# Patient Record
Sex: Female | Born: 1955 | Race: White | Hispanic: No | Marital: Married | Smoking: Never smoker
Health system: Southern US, Community
[De-identification: ages and names within clinical notes are randomized; demographics above are authoritative.]

## PROBLEM LIST (undated history)

## (undated) DIAGNOSIS — K449 Diaphragmatic hernia without obstruction or gangrene: Secondary | ICD-10-CM

## (undated) DIAGNOSIS — K222 Esophageal obstruction: Secondary | ICD-10-CM

## (undated) DIAGNOSIS — T7840XA Allergy, unspecified, initial encounter: Secondary | ICD-10-CM

## (undated) DIAGNOSIS — E041 Nontoxic single thyroid nodule: Secondary | ICD-10-CM

## (undated) DIAGNOSIS — F419 Anxiety disorder, unspecified: Secondary | ICD-10-CM

## (undated) DIAGNOSIS — E785 Hyperlipidemia, unspecified: Secondary | ICD-10-CM

## (undated) HISTORY — DX: Esophageal obstruction: K22.2

## (undated) HISTORY — DX: Allergy, unspecified, initial encounter: T78.40XA

## (undated) HISTORY — DX: Diaphragmatic hernia without obstruction or gangrene: K44.9

## (undated) HISTORY — PX: COLONOSCOPY: SHX174

## (undated) HISTORY — DX: Hyperlipidemia, unspecified: E78.5

## (undated) HISTORY — DX: Anxiety disorder, unspecified: F41.9

## (undated) HISTORY — DX: Nontoxic single thyroid nodule: E04.1

---

## 1989-09-11 HISTORY — PX: DILATION AND CURETTAGE OF UTERUS: SHX78

## 1998-05-11 ENCOUNTER — Other Ambulatory Visit: Admission: RE | Admit: 1998-05-11 | Discharge: 1998-05-11 | Payer: Self-pay | Admitting: Obstetrics & Gynecology

## 1999-05-29 ENCOUNTER — Encounter: Payer: Self-pay | Admitting: Emergency Medicine

## 1999-05-29 ENCOUNTER — Emergency Department (HOSPITAL_COMMUNITY): Admission: EM | Admit: 1999-05-29 | Discharge: 1999-05-29 | Payer: Self-pay | Admitting: Emergency Medicine

## 1999-10-05 ENCOUNTER — Other Ambulatory Visit: Admission: RE | Admit: 1999-10-05 | Discharge: 1999-10-05 | Payer: Self-pay | Admitting: Obstetrics & Gynecology

## 2000-09-12 ENCOUNTER — Other Ambulatory Visit: Admission: RE | Admit: 2000-09-12 | Discharge: 2000-09-12 | Payer: Self-pay | Admitting: Obstetrics & Gynecology

## 2002-06-16 ENCOUNTER — Other Ambulatory Visit: Admission: RE | Admit: 2002-06-16 | Discharge: 2002-06-16 | Payer: Self-pay | Admitting: Obstetrics & Gynecology

## 2003-06-29 ENCOUNTER — Other Ambulatory Visit: Admission: RE | Admit: 2003-06-29 | Discharge: 2003-06-29 | Payer: Self-pay | Admitting: Obstetrics & Gynecology

## 2004-06-29 ENCOUNTER — Other Ambulatory Visit: Admission: RE | Admit: 2004-06-29 | Discharge: 2004-06-29 | Payer: Self-pay | Admitting: Obstetrics & Gynecology

## 2005-10-10 ENCOUNTER — Other Ambulatory Visit: Admission: RE | Admit: 2005-10-10 | Discharge: 2005-10-10 | Payer: Self-pay | Admitting: Obstetrics & Gynecology

## 2013-07-19 ENCOUNTER — Encounter: Payer: Self-pay | Admitting: Cardiology

## 2013-07-20 ENCOUNTER — Encounter: Payer: Self-pay | Admitting: Cardiology

## 2013-07-23 ENCOUNTER — Ambulatory Visit (INDEPENDENT_AMBULATORY_CARE_PROVIDER_SITE_OTHER): Payer: 59 | Admitting: Cardiology

## 2013-07-23 ENCOUNTER — Encounter: Payer: Self-pay | Admitting: Cardiology

## 2013-07-23 VITALS — BP 121/84 | HR 72 | Ht 64.0 in | Wt 122.2 lb

## 2013-07-23 DIAGNOSIS — R002 Palpitations: Secondary | ICD-10-CM

## 2013-07-23 DIAGNOSIS — F411 Generalized anxiety disorder: Secondary | ICD-10-CM

## 2013-07-23 DIAGNOSIS — F419 Anxiety disorder, unspecified: Secondary | ICD-10-CM | POA: Insufficient documentation

## 2013-07-23 DIAGNOSIS — E785 Hyperlipidemia, unspecified: Secondary | ICD-10-CM

## 2013-07-23 LAB — BASIC METABOLIC PANEL
BUN: 14 mg/dL (ref 6–23)
CO2: 30 mEq/L (ref 19–32)
Calcium: 9.5 mg/dL (ref 8.4–10.5)
Chloride: 106 mEq/L (ref 96–112)
Creatinine, Ser: 0.7 mg/dL (ref 0.4–1.2)
GFR: 89.93 mL/min (ref >60.00)
Glucose, Bld: 100 mg/dL — ABNORMAL HIGH (ref 70–99)
Potassium: 4.1 mEq/L (ref 3.5–5.1)
Sodium: 142 mEq/L (ref 135–145)

## 2013-07-23 LAB — TSH: TSH: 2.11 u[IU]/mL (ref 0.35–5.50)

## 2013-07-23 NOTE — Progress Notes (Signed)
[address], [address] [place], [place]  [address] Phone: [phone] Fax:  [phone]  Date:  07/23/2013   ID:  Wanda Barber, DOB May 13, 1956, MRN [id number]  PCP:  Gretel Acre, MD   History of Present Illness: Wanda Barber is a 56 y.o. female with hyperlipidemia status post NMR lipid profile with LDL particle number 796-optimal, small LDL particle less than 90-optimal, LDL-C-84, HDL 72, triglycerides 51 - excellent profile here for evaluation of hyperlipidemia as well as family history of CAD.   She has no prior cardiovascular history. She was recently evaluated with sleep study. She was referred to ENT for snoring - no OSA.  Rare chest pain off and on for years. Has been sharp lasting several minutes, subsides with rest. Can be more of a pressure at times. Mild to moderate. Rarely with exertion. It has been a while since CP. Nuclear stress test December 2010 reassuring, low risk. Walks 3 miles 4 times a week, 30 min with her [place].   Does not sleep well. At night she notes when laying on side, she feels palpitations, skips. Not during day. Sits at work and she does not notice. Feels tightness in thyroid region.    LDL less than 100.   Wt Readings from Last 3 Encounters:  07/23/13 122 lb 3.2 oz (55.43 kg)     Past Medical History  Diagnosis Date  . Hyperlipidemia   . Anxiety     Past Surgical History  Procedure Laterality Date  . Dilation and curettage of uterus      due to miscariage    Current Outpatient Prescriptions  Medication Sig Dispense Refill  . ALPRAZolam (NIRAVAM) 0.25 MG dissolvable tablet Take 0.25 mg by mouth at bedtime as needed for anxiety.      Marland Kitchen aspirin 81 MG tablet Take 81 mg by mouth daily.      Marland Kitchen atorvastatin (LIPITOR) 20 MG tablet Take 20 mg by mouth daily.      . cholecalciferol (VITAMIN D) 1000 UNITS tablet Take 1,000 Units by mouth daily.      . fish oil-omega-3 fatty acids 1000 MG capsule Take 1 g by mouth daily.        No current facility-administered medications for this visit.    Allergies:   No Known Allergies  Social History:  The patient     ROS:  Please see the history of present illness.   Denies any syncope, bleeding, orthopnea, PND   PHYSICAL EXAM: VS:  BP 121/84  Wt 122 lb 3.2 oz (55.43 kg) Well nourished, well developed, in no acute distress HEENT: normal Neck: no JVD Cardiac:  normal S1, S2; RRR; no murmur Lungs:  clear to auscultation bilaterally, no wheezing, rhonchi or rales Abd: soft, nontender, no hepatomegaly Ext: no edema Skin: warm and dry Neuro: no focal abnormalities noted  EKG:  Prior EKG showed sinus rhythm with no other changes     ASSESSMENT AND PLAN:  1. Hyperlipidemia-doing well on atorvastatin. Prior NMR profile reviewed. Reassuring. Continue with diet, exercise. Primary prevention. Father with coronary artery disease. 2. Palpitations-newly described. Way that she is describing laying on left or right side in the middle of the night and feeling an occasional pause like beat, these are suggestive of PVCs or PACs. EKG today reassuring. 3. I will check a basic metabolic profile as well as TSH with her recent palpitations as well as trouble sleeping.  Signed, Donato Schultz, MD [facility]  07/23/2013 10:30  AM    

## 2013-07-23 NOTE — Patient Instructions (Addendum)
Your physician recommends that you continue on your current medications as directed. Please refer to the Current Medication list given to you today.  Your physician recommends that you have labs today: BMET, TSH   Your physician wants you to follow-up in: 1 year with Dr. Anne Fu. You will receive a reminder letter in the mail two months in advance. If you don't receive a letter, please call our office to schedule the follow-up appointment.

## 2013-07-24 ENCOUNTER — Telehealth: Payer: Self-pay | Admitting: *Deleted

## 2013-07-24 NOTE — Telephone Encounter (Signed)
lmtcb for lab results. Number provided 

## 2014-03-11 ENCOUNTER — Other Ambulatory Visit: Payer: Self-pay | Admitting: Family Medicine

## 2014-03-11 DIAGNOSIS — R221 Localized swelling, mass and lump, neck: Secondary | ICD-10-CM

## 2014-03-11 DIAGNOSIS — R220 Localized swelling, mass and lump, head: Secondary | ICD-10-CM

## 2014-03-23 ENCOUNTER — Ambulatory Visit
Admission: RE | Admit: 2014-03-23 | Discharge: 2014-03-23 | Disposition: A | Discharge status: Home / Self Care | Payer: 59 | Source: Ambulatory Visit | Attending: Family Medicine | Admitting: Family Medicine

## 2014-03-23 DIAGNOSIS — R221 Localized swelling, mass and lump, neck: Secondary | ICD-10-CM

## 2014-03-23 DIAGNOSIS — R220 Localized swelling, mass and lump, head: Secondary | ICD-10-CM

## 2015-02-03 ENCOUNTER — Ambulatory Visit: Payer: Self-pay | Admitting: Cardiology

## 2015-02-04 ENCOUNTER — Encounter: Payer: Self-pay | Admitting: Cardiology

## 2015-02-04 ENCOUNTER — Ambulatory Visit (INDEPENDENT_AMBULATORY_CARE_PROVIDER_SITE_OTHER): Payer: 59 | Admitting: Cardiology

## 2015-02-04 VITALS — BP 110/60 | HR 75 | Ht 64.0 in | Wt 127.8 lb

## 2015-02-04 DIAGNOSIS — E785 Hyperlipidemia, unspecified: Secondary | ICD-10-CM | POA: Diagnosis not present

## 2015-02-04 DIAGNOSIS — R0683 Snoring: Secondary | ICD-10-CM | POA: Diagnosis not present

## 2015-02-04 DIAGNOSIS — R002 Palpitations: Secondary | ICD-10-CM | POA: Diagnosis not present

## 2015-02-04 NOTE — Progress Notes (Signed)
[place], [address] [place], [place]  [address] Phone: [phone] Fax:  [phone]  Date:  02/04/2015   ID:  Wanda Barber, DOB [birth year], MRN [id number]  PCP:  Gavin Pound, MD   History of Present Illness: Wanda Barber is a 59 y.o. female with hyperlipidemia status post NMR lipid profile with LDL particle number 796-optimal, small LDL particle less than 90-optimal, LDL-C-84, HDL 72, triglycerides 51 - excellent profile here for evaluation of hyperlipidemia as well as family history of CAD.   She has no prior cardiovascular history. She was recently evaluated with sleep study. She was referred to ENT for snoring - no OSA.  Nuclear stress test December 2010 reassuring, low risk. Walks 3 miles 4 times a week, 30 min with her [place]. Also does yoga.  Does not sleep well. At night she notes when laying on side, she feels palpitations, skips. Not during day. Sits at work and she does not notice. Feels tightness in thyroid region.  LDL less than 100.  Not taking aspirin. She does take fish oil. No bleeding.   Wt Readings from Last 3 Encounters:  02/04/15 127 lb 12.8 oz (57.97 kg)  07/23/13 122 lb 3.2 oz (55.43 kg)     Past Medical History  Diagnosis Date  . Hyperlipidemia   . Anxiety     Past Surgical History  Procedure Laterality Date  . Dilation and curettage of uterus      due to miscariage    Current Outpatient Prescriptions  Medication Sig Dispense Refill  . ALPRAZolam (NIRAVAM) 0.25 MG dissolvable tablet Take 0.25 mg by mouth at bedtime as needed for anxiety.    Marland Kitchen atorvastatin (LIPITOR) 20 MG tablet Take 20 mg by mouth daily.    . cholecalciferol (VITAMIN D) 1000 UNITS tablet Take 1,000 Units by mouth daily.    . fish oil-omega-3 fatty acids 1000 MG capsule Take 1 g by mouth daily.     No current facility-administered medications for this visit.    Allergies:   No Known Allergies  Social History:  The patient  reports that she has  never smoked. She does not have any smokeless tobacco history on file. She reports that she drinks about 0.6 oz of alcohol per week.   ROS:  Please see the history of present illness.   Denies any syncope, bleeding, orthopnea, PND   PHYSICAL EXAM: VS:  BP 110/60 mmHg  Pulse 75  Ht 5\' 4"  (1.626 m)  Wt 127 lb 12.8 oz (57.97 kg)  BMI 21.93 kg/m2 Well nourished, well developed, in no acute distress HEENT: normal Neck: no JVD Cardiac:  normal S1, S2; RRR; no murmur Lungs:  clear to auscultation bilaterally, no wheezing, rhonchi or rales Abd: soft, nontender, no hepatomegaly Ext: no edema Skin: warm and dry Neuro: no focal abnormalities noted  EKG:  Today 02/04/15-normal sinus rhythm, possible left atrial enlargement, nonspecific ST-T wave flattening. Prior EKG showed sinus rhythm with no other changes     ASSESSMENT AND PLAN:  1. Hyperlipidemia-doing well on atorvastatin. Prior NMR profile reviewed. Reassuring. Continue with diet, exercise. Primary prevention. Father with coronary artery disease. 2. Palpitations-newly described. Way that she is describing laying on left or right side in the middle of the night and feeling an occasional pause like beat, these are suggestive of PVCs or PACs. On side, ready to go to sleep.  EKG today reassuring. Lab work was reassuring. TSH reassuring. If these worsen, have low threshold  for 24 or 48 hour Holter monitor. 3. Snoring - had sleep study, no apnea. She asked about correlation with snoring and carotid artery disease. Continue with aggressive primary prevention. No bruits heard on exam. I'm fine with her not taking aspirin for prevention. 4. One-year follow-up.    Signed, Candee Furbish, MD [facility]  02/04/2015 2:03 PM

## 2015-02-04 NOTE — Patient Instructions (Signed)
Medication Instructions:  Your physician recommends that you continue on your current medications as directed. Please refer to the Current Medication list given to you today.  Follow-Up: Follow up in 1 year with Dr. Skains.  You will receive a letter in the mail 2 months before you are due.  Please call us when you receive this letter to schedule your follow up appointment.  Thank you for choosing [facility]!!       

## 2015-03-16 ENCOUNTER — Encounter: Payer: Self-pay | Admitting: Gastroenterology

## 2015-03-17 ENCOUNTER — Encounter: Payer: Self-pay | Admitting: Physician Assistant

## 2015-03-17 ENCOUNTER — Ambulatory Visit (INDEPENDENT_AMBULATORY_CARE_PROVIDER_SITE_OTHER): Payer: 59 | Admitting: Physician Assistant

## 2015-03-17 VITALS — BP 110/64 | HR 62 | Ht 63.5 in | Wt 126.2 lb

## 2015-03-17 DIAGNOSIS — R1314 Dysphagia, pharyngoesophageal phase: Secondary | ICD-10-CM | POA: Diagnosis not present

## 2015-03-17 NOTE — Patient Instructions (Signed)

## 2015-03-17 NOTE — Progress Notes (Addendum)
Patient ID: Wanda Barber, female   DOB: Jul 23, 1956, 59 y.o.   MRN: [id number]   Subjective:    Patient ID: Wanda Barber, female    DOB: [birth year], 59 y.o.   MRN: [id number]  HPI Wanda Barber is a pleasant 59 year old white female, new to GI today referred by [employer] family Barber or evaluation of dysphagia. Patient relates having had a colonoscopy at age 42 for screening. This was done per Wanda Barber  and by patient's report was normal. She believes she is due for follow-up next year. She states that she has had difficulty intermittently with swallowing over the past few years and that this has not been a major issue but more of an annoyance. He says these episodes are infrequent and seemed to depend on what she eats and how fast she eats. She has had difficulty with breads and meats primarily and sometimes barbecue. If she has an episode she has to stop eating, feels the food hanging up and generally with time will go on down. Has had some episodes of regurgitation. She denies any ongoing problems with heartburn or indigestion. Generally these dysphagia episodes occur a few times per month and have not increased in frequency. Her appetite is been fine her weight is been stable. She denies any lower GI problems, specifically no abdominal pain and changes in bowel habits melena or hematochezia. Her family history is negative for colon cancer.  Review of Systems Pertinent positive and negative review of systems were noted in the above HPI section.  All other review of systems was otherwise negative.  Outpatient Encounter Prescriptions as of 03/17/2015  Medication Sig  . ALPRAZolam (NIRAVAM) 0.25 MG dissolvable tablet Take 0.25 mg by mouth at bedtime as needed for anxiety.  Marland Kitchen atorvastatin (LIPITOR) 20 MG tablet Take 20 mg by mouth daily.  . cholecalciferol (VITAMIN D) 1000 UNITS tablet Take 1,000 Units by mouth daily.  . fish oil-omega-3 fatty acids 1000 MG capsule Take 1 g by  mouth daily.  . Multiple Vitamins-Minerals (MULTIVITAMIN WITH MINERALS) tablet Take 1 tablet by mouth daily.   No facility-administered encounter medications on file as of 03/17/2015.   Allergies  Allergen Reactions  . Paxil [Paroxetine Hcl] Other (See Comments)    Decreased appetite, sleep problems   Patient Active Problem List   Diagnosis Date Noted  . Snoring 02/04/2015  . Palpitations 07/23/2013  . Hyperlipidemia   . Anxiety    History   Social History  . Marital Status: Married    Spouse Name: N/A  . Number of Children: 2  . Years of Education: N/A   Occupational History  . Sales    Social History Main Topics  . Smoking status: Never Smoker   . Smokeless tobacco: Not on file     Comment: Occassionally  . Alcohol Use: 0.6 oz/week    1 Glasses of wine per week     Comment: glass of wine with dinner  . Drug Use: Not on file  . Sexual Activity: Not on file   Other Topics Concern  . Not on file   Social History Narrative    Wanda Barber's family history includes Asthma in her mother; Fibromyalgia in her sister; Heart disease in her father; Heart failure in her maternal grandmother and mother; Hypertension in her father; Pulmonary fibrosis in her father; Rheum arthritis in her mother.      Objective:    Filed Vitals:   03/17/15 0933  BP: 110/64  Pulse: 62  Physical Exam  well-developed white female in no acute distress, pleasant blood pressure 110/64 pulse 52 height 5 foot 3 weight 126, BMI 22. HEENT; nontraumatic normocephalic EOMI PERRLA sclera anicteric, Supple; no JVD, Cardiovascular; regular rate and rhythm with S1-S2 no murmur or gallop, Pulmonary; clear bilaterally, Abdomen ;soft nontender nondistended bowel sounds are active there is no palpable mass or hepatosplenomegaly, Rectal ;exam not done, Extremities; no clubbing cyanosis or edema skin warm and dry, Neuropsych; mood and affect appropriate       Assessment & Plan:   #1 59 yo female with  intermittent solid food dysphagia over past few years - R/O esophageal stricture or ring #2 Colon neoplasia surveiilance- negative colonoscopy 9 years ago([facility]) #3 hyperlipidemia  Plan; Will schedule for EGD with possible Dilation  with Dr. Hilarie Barber . Procedure discussed in detail with pt and she is agreeable to proceed.   Will obtain copy of prior Colonoscopy,then place recall notice -2017   Wanda Ferguson PA-C 03/17/2015   Cc: Wanda Pound, MD  Addendum: Reviewed and agree with initial management. Jerene Bears, MD

## 2015-03-18 NOTE — Addendum Note (Signed)
Addended by: Jerene Bears on: 03/18/2015 05:11 PM   Modules accepted: Level of Service

## 2015-04-13 ENCOUNTER — Telehealth: Payer: Self-pay | Admitting: *Deleted

## 2015-04-13 NOTE — Telephone Encounter (Signed)
Received fax from [facility] ( Dr. Perley Jain office). That office representative was not able to find any information on this patient in their system.  We faxed a signed release on 03-17-2015 when this patient saw Amy Esterwood PA-C.

## 2015-04-15 ENCOUNTER — Other Ambulatory Visit: Payer: Self-pay | Admitting: Family Medicine

## 2015-04-15 DIAGNOSIS — E041 Nontoxic single thyroid nodule: Secondary | ICD-10-CM

## 2015-04-29 ENCOUNTER — Encounter: Payer: 59 | Admitting: Internal Medicine

## 2015-06-29 ENCOUNTER — Encounter: Payer: Self-pay | Admitting: Internal Medicine

## 2015-06-29 ENCOUNTER — Ambulatory Visit (AMBULATORY_SURGERY_CENTER): Payer: 59 | Admitting: Internal Medicine

## 2015-06-29 VITALS — BP 122/68 | HR 69 | Temp 97.0°F | Resp 16 | Ht 63.5 in | Wt 126.0 lb

## 2015-06-29 DIAGNOSIS — K222 Esophageal obstruction: Secondary | ICD-10-CM

## 2015-06-29 DIAGNOSIS — R1314 Dysphagia, pharyngoesophageal phase: Secondary | ICD-10-CM

## 2015-06-29 DIAGNOSIS — Q394 Esophageal web: Secondary | ICD-10-CM

## 2015-06-29 MED ORDER — SODIUM CHLORIDE 0.9 % IV SOLN: 500.0000 mL | INTRAVENOUS | Status: DC

## 2015-06-29 NOTE — Op Note (Signed)
[place]  [address] [place], [id number]   ENDOSCOPY PROCEDURE REPORT  PATIENT: Wanda Barber, Wanda Barber  MR#: [id number] BIRTHDATE: 04-30-1956 , 70  yrs. old GENDER: female ENDOSCOPIST: Jerene Bears, MD REFERRED BY:  Leighton Ruff, M.D. PROCEDURE DATE:  06/29/2015 PROCEDURE:  EGD, diagnostic and EGD w/ balloon dilation ASA CLASS:     Class II INDICATIONS:  dysphagia. MEDICATIONS: Monitored anesthesia care and Propofol 350 mg IV TOPICAL ANESTHETIC: none  DESCRIPTION OF PROCEDURE: After the risks benefits and alternatives of the procedure were thoroughly explained, informed consent was obtained.  The LB [id number] endoscope was introduced through the mouth and advanced to the second portion of the duodenum , Without limitations.  The instrument was slowly withdrawn as the mucosa was fully examined.  ESOPHAGUS: Normal esophageal mucosa.  A Schatzki ring was found 36 cm from the incisors, initially requiring mild pressure to cross. Using a TTS-balloon the stricture was dilated initially to 12 mm and then to 13.5 mm.  The balloon was held inflated for 60 seconds. Following this dilation, there was a small mucosal rent.   No evidence of Barrett's mucosal change.  STOMACH: A 4 cm hiatal hernia was noted.   The mucosa of the stomach appeared normal.  DUODENUM: The duodenal mucosa showed no abnormalities in the bulb and 2nd part of the duodenum.  Retroflexed views revealed a hiatal hernia.     The scope was then withdrawn from the patient and the procedure completed.  COMPLICATIONS: There were no immediate complications.  ENDOSCOPIC IMPRESSION: 1.   Schatzki ring was found 36 cm from the incisors; balloon dilation to 13.5 mm 2.   4 cm hiatal hernia 3.   The mucosa of the stomach appeared normal 4.   The duodenal mucosa showed no abnormalities in the bulb and 2nd part of the duodenum  RECOMMENDATIONS: 1.  Anti-reflux regimen 2.  Soft diet today,  slowly advance as tolerated 3.  Office visit next available to assess response.  Repeat dilation as needed  eSigned:  Jerene Bears, MD 06/29/2015 10:21 AM   CC:The Patient and Leighton Ruff, MD

## 2015-06-29 NOTE — Progress Notes (Signed)
Called to room to assist during endoscopic procedure.  Patient ID and intended procedure confirmed with present staff. Received instructions for my participation in the procedure from the performing physician.  

## 2015-06-29 NOTE — Progress Notes (Signed)
To recovery, report to Doyle Askew, RN, VSS,

## 2015-06-29 NOTE — Progress Notes (Signed)
Dental advisory given to patient 

## 2015-06-29 NOTE — Patient Instructions (Addendum)
YOU HAD AN ENDOSCOPIC PROCEDURE TODAY AT [facility]:   Refer to the procedure report that was given to you for any specific questions about what was found during the examination.  If the procedure report does not answer your questions, please call your gastroenterologist to clarify.  If you requested that your care partner not be given the details of your procedure findings, then the procedure report has been included in a sealed envelope for you to review at your convenience later.  YOU SHOULD EXPECT: Some feelings of bloating in the abdomen. Passage of more gas than usual.  Walking can help get rid of the air that was put into your GI tract during the procedure and reduce the bloating. If you had a lower endoscopy (such as a colonoscopy or flexible sigmoidoscopy) you may notice spotting of blood in your stool or on the toilet paper. If you underwent a bowel prep for your procedure, you may not have a normal bowel movement for a few days.  Please Note:  You might notice some irritation and congestion in your nose or some drainage.  This is from the oxygen used during your procedure.  There is no need for concern and it should clear up in a day or so.  SYMPTOMS TO REPORT IMMEDIATELY:   Following upper endoscopy (EGD)  Vomiting of blood or coffee ground material  New chest pain or pain under the shoulder blades  Painful or persistently difficult swallowing  New shortness of breath  Fever of 100F or higher  Black, tarry-looking stools  For urgent or emergent issues, a gastroenterologist can be reached at any hour by calling [phone].   DIET: Your first meal following the procedure should be a small meal and then it is ok to progress to your normal diet. Heavy or fried foods are harder to digest and may make you feel nauseous or bloated.  Likewise, meals heavy in dairy and vegetables can increase bloating.  Drink plenty of fluids but you should avoid alcoholic beverages for  24 hours.  ACTIVITY:  You should plan to take it easy for the rest of today and you should NOT DRIVE or use heavy machinery until tomorrow (because of the sedation medicines used during the test).    FOLLOW UP: Our staff will call the number listed on your records the next business day following your procedure to check on you and address any questions or concerns that you may have regarding the information given to you following your procedure. If we do not reach you, we will leave a message.  However, if you are feeling well and you are not experiencing any problems, there is no need to return our call.  We will assume that you have returned to your regular daily activities without incident.  If any biopsies were taken you will be contacted by phone or by letter within the next 1-3 weeks.  Please call us at [phone] if you have not heard about the biopsies in 3 weeks.    SIGNATURES/CONFIDENTIALITY: You and/or your care partner have signed paperwork which will be entered into your electronic medical record.  These signatures attest to the fact that that the information above on your After Visit Summary has been reviewed and is understood.  Full responsibility of the confidentiality of this discharge information lies with you and/or your care-partner.  Please follow dilation diet instructions given and review dilation diet and GERD handouts provided. Office will call with follow up  appointment date and time.

## 2015-06-30 ENCOUNTER — Telehealth: Payer: Self-pay

## 2015-06-30 NOTE — Telephone Encounter (Signed)
Left a message at [phone] for the pt to call us back if any questions or concerns. maw

## 2015-08-27 ENCOUNTER — Ambulatory Visit: Payer: 59 | Admitting: Internal Medicine

## 2015-08-30 ENCOUNTER — Encounter: Payer: Self-pay | Admitting: *Deleted

## 2015-09-15 ENCOUNTER — Ambulatory Visit (INDEPENDENT_AMBULATORY_CARE_PROVIDER_SITE_OTHER): Payer: 59 | Admitting: Internal Medicine

## 2015-09-15 ENCOUNTER — Encounter: Payer: Self-pay | Admitting: Internal Medicine

## 2015-09-15 VITALS — BP 122/70 | HR 80 | Ht 63.5 in | Wt 128.0 lb

## 2015-09-15 DIAGNOSIS — K449 Diaphragmatic hernia without obstruction or gangrene: Secondary | ICD-10-CM

## 2015-09-15 DIAGNOSIS — R1314 Dysphagia, pharyngoesophageal phase: Secondary | ICD-10-CM | POA: Diagnosis not present

## 2015-09-15 DIAGNOSIS — K219 Gastro-esophageal reflux disease without esophagitis: Secondary | ICD-10-CM | POA: Diagnosis not present

## 2015-09-15 DIAGNOSIS — K222 Esophageal obstruction: Secondary | ICD-10-CM

## 2015-09-15 DIAGNOSIS — Z1211 Encounter for screening for malignant neoplasm of colon: Secondary | ICD-10-CM | POA: Diagnosis not present

## 2015-09-15 DIAGNOSIS — R1319 Other dysphagia: Secondary | ICD-10-CM

## 2015-09-15 DIAGNOSIS — Q394 Esophageal web: Secondary | ICD-10-CM | POA: Diagnosis not present

## 2015-09-15 DIAGNOSIS — R1310 Dysphagia, unspecified: Secondary | ICD-10-CM

## 2015-09-15 MED ORDER — NA SULFATE-K SULFATE-MG SULF 17.5-3.13-1.6 GM/177ML PO SOLN: ORAL | Status: DC

## 2015-09-15 MED ORDER — PANTOPRAZOLE SODIUM 40 MG PO TBEC: 40.0000 mg | DELAYED_RELEASE_TABLET | Freq: Every day | ORAL | Status: DC

## 2015-09-15 NOTE — Progress Notes (Signed)
   Subjective:    Patient ID: Wanda Barber, female    DOB: March 12, 1956, 60 y.o.   MRN: [id number]  HPI  Wanda Barber is a 60 year old female with past medical history of Schatzki's ring , hiatal hernia, hyperlipidemia and anxiety who is here for follow-up. She was initially seen in the summer of 2016  By Nicoletta Ba, PA-C to discuss solid food dysphagia.   Upper endoscopy is recommended and performed on 06/29/2015. This revealed Schatzki's ring which was dilated to 13.5 mm using TTS balloon. This resulted in a mucosal tear and thus ring disruption. There is no evidence of Barrett's esophagus.   She reports that her dysphagia symptoms are better but not 100% resolved. She reports she remains "cautious" with swallowing particular breads, meats and other more coarse solids. No trouble with liquids. She reports less than 5 episodes since endoscopy in October. Symptoms are sporadic. No weight loss. No heartburn or history of heartburn. No odynophagia. No early satiety, nausea or vomiting. No weight loss. She seems to remember trying a "acid reducer" some years ago which didn't seem to help. Bowel movements are regular without blood in her stool or melena. She had screening colonoscopy at age 51 with Dr. Watt Climes,  Which she reports was normal. She is due screening colonoscopy this year. No family history of colon cancer.   Review of Systems  as per history of present illness, otherwise negative  Current Medications, Allergies, Past Medical History, Past Surgical History, Family History and Social History were reviewed in [employer] record.     Objective:   Physical Exam BP 122/70 mmHg  Pulse 80  Ht 5' 3.5" (1.613 m)  Wt 128 lb (58.06 kg)  BMI 22.32 kg/m2 Constitutional: Well-developed and well-nourished. No distress. HEENT: Normocephalic and atraumatic. Oropharynx is clear and moist. No oropharyngeal exudate. Conjunctivae are normal.  No scleral icterus. Neck: Neck  supple. Trachea midline. Cardiovascular: Normal rate, regular rhythm and intact distal pulses. No M/R/G Pulmonary/chest: Effort normal and breath sounds normal. No wheezing, rales or rhonchi. Abdominal: Soft, nontender, nondistended. Bowel sounds active throughout. There are no masses palpable. No hepatosplenomegaly. Extremities: no clubbing, cyanosis, or edema Neurological: Alert and oriented to person place and time. Skin: Skin is warm and dry.  Psychiatric: Normal mood and affect. Behavior is normal.      Assessment & Plan:  60 year old female with past medical history of Schatzki's ring , hiatal hernia, hyperlipidemia and anxiety who is here for follow-up.  1.  Solid food dysphagia/ Schatzki's ring/ hiatal hernia --  She is some better after esophageal dilation to 13.5 mm though she still has some intermittent solid food dysphagia. This may be secondary to silent reflux leading to edema or even esophagitis at the GE junction. We discussed how her hiatal hernia makes reflux  More likely. Trial of pantoprazole 40 mg daily for 1-2 months. PPI trial to see if dysphagia completely resolves. If not, could consider repeat dilation with plan to dilate to a larger size than previously if tolerated/indicated at the time of that endoscopy. If no response to PPI than it should be discontinued. Finally esophageal manometry could be considered if symptoms persist.   2. CRC screening --  Average risk last screening colonoscopy normal at age 51. She will be 60 later this month and is due for repeat screening exam at this time. We discussed the risks, benefits and alternatives and she is agreeable to proceed.

## 2015-09-15 NOTE — Patient Instructions (Signed)
You have been scheduled for a colonoscopy. Please follow written instructions given to you at your visit today.  Please pick up your prep supplies at the pharmacy within the next 1-3 days. If you use inhalers (even only as needed), please bring them with you on the day of your procedure. Your physician has requested that you go to www.startemmi.com and enter the access code given to you at your visit today. This web site gives a general overview about your procedure. However, you should still follow specific instructions given to you by our office regarding your preparation for the procedure.  We have sent the following medications to your pharmacy for you to pick up at your convenience: Protonix 40 mg daily

## 2015-10-01 ENCOUNTER — Encounter: Payer: Self-pay | Admitting: Internal Medicine

## 2015-11-04 ENCOUNTER — Telehealth: Payer: Self-pay | Admitting: *Deleted

## 2015-11-04 NOTE — Telephone Encounter (Signed)
-----   Message from Larina Bras, [place] sent at 09/15/2015 10:56 AM EST ----- Does pt need endo? Already scheduled for colon 11/19/15. Per pyrtle may need to add endo depending on pt response to pantoprazole. See 09/15/15 office note.... If NOT needed, take hold off 4 pm slot 11/19/15

## 2015-11-04 NOTE — Telephone Encounter (Signed)
I contacted patient. She states that she is actually better on the pantoprazole but would like to call us back at the beginning of next week with a decision of whether she would like to have endoscopy or just colonoscopy.

## 2015-11-12 NOTE — Telephone Encounter (Signed)
Patient called back to let us know that she would like to only have colonoscopy. Therefore, I will take hold off of the 4 pm slot 11/19/15.

## 2015-11-18 NOTE — Progress Notes (Signed)
Pt was originally scheduled for 3:30 pm on 11/19/15, called pt to see if she could come in at 1:30 pm instead, pt will come in at 1:30 pm 11/19/15, advised pt to start her second dose of prep at 8:30 am, nothing to drink after 10 am and she needs to arrive here by 12:30 pm. Pt verbalized understanding of new instructions-adm

## 2015-11-19 ENCOUNTER — Encounter: Payer: Self-pay | Admitting: Internal Medicine

## 2015-11-19 ENCOUNTER — Ambulatory Visit (AMBULATORY_SURGERY_CENTER): Payer: 59 | Admitting: Internal Medicine

## 2015-11-19 VITALS — BP 161/86 | HR 68 | Temp 99.8°F | Resp 13 | Ht 63.5 in | Wt 128.0 lb

## 2015-11-19 DIAGNOSIS — Z1211 Encounter for screening for malignant neoplasm of colon: Secondary | ICD-10-CM

## 2015-11-19 DIAGNOSIS — D123 Benign neoplasm of transverse colon: Secondary | ICD-10-CM

## 2015-11-19 DIAGNOSIS — D125 Benign neoplasm of sigmoid colon: Secondary | ICD-10-CM

## 2015-11-19 HISTORY — PX: COLONOSCOPY: SHX174

## 2015-11-19 MED ORDER — SODIUM CHLORIDE 0.9 % IV SOLN: 500.0000 mL | INTRAVENOUS | Status: DC

## 2015-11-19 NOTE — Op Note (Signed)
Wanda Barber: Wanda Barber Procedure Date: 11/19/2015 1:05 PM MRN: [id number] Endoscopist: Jerene Bears , MD Age: 60 Referring MD:  Date of Birth: November 04, 1955 Gender: Female Procedure:                Colonoscopy Indications:              Screening for colorectal malignant neoplasm, Last                            colonoscopy 10 years ago Medicines:                Monitored Anesthesia Care Procedure:                Pre-Anesthesia Assessment:                           - Prior to the procedure, a History and Physical                            was performed, and patient medications and                            allergies were reviewed. The patient's tolerance of                            previous anesthesia was also reviewed. The risks                            and benefits of the procedure and the sedation                            options and risks were discussed with the patient.                            All questions were answered, and informed consent                            was obtained. Prior Anticoagulants: The patient has                            taken no previous anticoagulant or antiplatelet                            agents. ASA Grade Assessment: II - A patient with                            mild systemic disease. After reviewing the risks                            and benefits, the patient was deemed in                            satisfactory condition to undergo the procedure.  After obtaining informed consent, the colonoscope                            was passed under direct vision. Throughout the                            procedure, the patient's blood pressure, pulse, and                            oxygen saturations were monitored continuously. The                            Model PCF-H190L [id number]) scope was introduced                            through the anus and advanced to the the cecum,                     identified by appendiceal orifice and ileocecal                            valve. The colonoscopy was performed without                            difficulty. The patient tolerated the procedure                            well. The quality of the bowel preparation was                            good. The ileocecal valve, appendiceal orifice, and                            rectum were photographed. Scope In: 1:44:06 PM Scope Out: 1:57:52 PM Scope Withdrawal Time: 0 hours 11 minutes 24 seconds  Total Procedure Duration: 0 hours 13 minutes 46 seconds  Findings:      The digital rectal exam was normal.      Two sessile polyps were found in the distal sigmoid colon and proximal       transverse colon. The polyps were 3 to 5 mm in size. These polyps were       removed with a cold snare. Resection and retrieval were complete.      Multiple small-mouthed diverticula were found in the hepatic flexure and       left colon.      The retroflexed view of the distal rectum and anal verge was normal and       showed no anal or rectal abnormalities. Complications:            No immediate complications. Estimated Blood Loss:     Estimated blood loss was minimal. Impression:               - Two 3 to 5 mm polyps in the distal sigmoid colon                            and in the proximal transverse colon,  removed with                            a cold snare. Resected and retrieved.                           - Mild diverticulosis at the hepatic flexure and in                            the left colon.                           - The distal rectum and anal verge are normal on                            retroflexion view. Recommendation:           - Patient has a contact number available for                            emergencies. The signs and symptoms of potential                            delayed complications were discussed with the                            patient. Return to normal  activities tomorrow.                            Written discharge instructions were provided to the                            patient.                           - Resume previous diet.                           - Continue present medications.                           - Await pathology results.                           - Repeat colonoscopy is recommended for                            surveillance. The colonoscopy date will be                            determined after pathology results from today's                            exam become available for review. Procedure Code(s):        --- Professional ---  45385, Colonoscopy, flexible; with removal of                            tumor(s), polyp(s), or other lesion(s) by snare                            technique CPT copyright 2016 [employer] Association. All rights reserved. Lajuan Lines. Hilarie Fredrickson, MD Jerene Bears, MD 11/19/2015 2:02:03 PM This report has been signed electronically. Number of Addenda: 0

## 2015-11-19 NOTE — Patient Instructions (Signed)
YOU HAD AN ENDOSCOPIC PROCEDURE TODAY AT [facility]:   Refer to the procedure report that was given to you for any specific questions about what was found during the examination.  If the procedure report does not answer your questions, please call your gastroenterologist to clarify.  If you requested that your care partner not be given the details of your procedure findings, then the procedure report has been included in a sealed envelope for you to review at your convenience later.  YOU SHOULD EXPECT: Some feelings of bloating in the abdomen. Passage of more gas than usual.  Walking can help get rid of the air that was put into your GI tract during the procedure and reduce the bloating. If you had a lower endoscopy (such as a colonoscopy or flexible sigmoidoscopy) you may notice spotting of blood in your stool or on the toilet paper. If you underwent a bowel prep for your procedure, you may not have a normal bowel movement for a few days.  Please Note:  You might notice some irritation and congestion in your nose or some drainage.  This is from the oxygen used during your procedure.  There is no need for concern and it should clear up in a day or so.  SYMPTOMS TO REPORT IMMEDIATELY:   Following lower endoscopy (colonoscopy or flexible sigmoidoscopy):  Excessive amounts of blood in the stool  Significant tenderness or worsening of abdominal pains  Swelling of the abdomen that is new, acute  Fever of 100F or higher  For urgent or emergent issues, a gastroenterologist can be reached at any hour by calling [phone].   DIET: Your first meal following the procedure should be a small meal and then it is ok to progress to your normal diet. Heavy or fried foods are harder to digest and may make you feel nauseous or bloated.  Likewise, meals heavy in dairy and vegetables can increase bloating.  Drink plenty of fluids but you should avoid alcoholic beverages for 24  hours.  ACTIVITY:  You should plan to take it easy for the rest of today and you should NOT DRIVE or use heavy machinery until tomorrow (because of the sedation medicines used during the test).    FOLLOW UP: Our staff will call the number listed on your records the next business day following your procedure to check on you and address any questions or concerns that you may have regarding the information given to you following your procedure. If we do not reach you, we will leave a message.  However, if you are feeling well and you are not experiencing any problems, there is no need to return our call.  We will assume that you have returned to your regular daily activities without incident.  If any biopsies were taken you will be contacted by phone or by letter within the next 1-3 weeks.  Please call us at [phone] if you have not heard about the biopsies in 3 weeks.    SIGNATURES/CONFIDENTIALITY: You and/or your care partner have signed paperwork which will be entered into your electronic medical record.  These signatures attest to the fact that that the information above on your After Visit Summary has been reviewed and is understood.  Full responsibility of the confidentiality of this discharge information lies with you and/or your care-partner.  Please review polyp, diverticulosis, and high fiber diet handouts provided. Next colonoscopy determined by pathology results.

## 2015-11-19 NOTE — Progress Notes (Signed)
Called to room to assist during endoscopic procedure.  Patient ID and intended procedure confirmed with present staff. Received instructions for my participation in the procedure from the performing physician.  

## 2015-11-19 NOTE — Progress Notes (Signed)
Report to PACU, RN, vss, BBS= Clear.  

## 2015-11-22 ENCOUNTER — Telehealth: Payer: Self-pay

## 2015-11-22 NOTE — Telephone Encounter (Signed)
  Follow up Call-  Call back number 11/19/2015 06/29/2015  Post procedure Call Back phone  # [phone]  Permission to leave phone message Yes Yes     Patient questions:  Do you have a fever, pain , or abdominal swelling? No. Pain Score  0 *  Have you tolerated food without any problems? Yes.    Have you been able to return to your normal activities? Yes.    Do you have any questions about your discharge instructions: Diet   No. Medications  No. Follow up visit  No.  Do you have questions or concerns about your Care? No.  Actions: * If pain score is 4 or above: No action needed, pain <4.

## 2015-11-23 ENCOUNTER — Telehealth: Payer: Self-pay | Admitting: Internal Medicine

## 2015-11-23 MED ORDER — PANTOPRAZOLE SODIUM 40 MG PO TBEC: 40.0000 mg | DELAYED_RELEASE_TABLET | Freq: Every day | ORAL | Status: DC

## 2015-11-23 NOTE — Telephone Encounter (Signed)
Rx sent 

## 2015-11-24 ENCOUNTER — Encounter: Payer: Self-pay | Admitting: Internal Medicine

## 2015-12-27 ENCOUNTER — Other Ambulatory Visit: Payer: Self-pay | Admitting: Family Medicine

## 2015-12-27 DIAGNOSIS — E041 Nontoxic single thyroid nodule: Secondary | ICD-10-CM

## 2016-01-03 ENCOUNTER — Ambulatory Visit
Admission: RE | Admit: 2016-01-03 | Discharge: 2016-01-03 | Disposition: A | Discharge status: Home / Self Care | Payer: 59 | Source: Ambulatory Visit | Attending: Family Medicine | Admitting: Family Medicine

## 2016-01-03 DIAGNOSIS — E041 Nontoxic single thyroid nodule: Secondary | ICD-10-CM

## 2016-03-23 ENCOUNTER — Other Ambulatory Visit: Payer: Self-pay | Admitting: Family Medicine

## 2016-03-23 DIAGNOSIS — E041 Nontoxic single thyroid nodule: Secondary | ICD-10-CM

## 2016-04-07 ENCOUNTER — Encounter: Payer: Self-pay | Admitting: Cardiology

## 2016-04-07 ENCOUNTER — Ambulatory Visit (INDEPENDENT_AMBULATORY_CARE_PROVIDER_SITE_OTHER): Payer: 59 | Admitting: Cardiology

## 2016-04-07 VITALS — BP 120/70 | HR 68 | Ht 64.0 in | Wt 132.4 lb

## 2016-04-07 DIAGNOSIS — Z8249 Family history of ischemic heart disease and other diseases of the circulatory system: Secondary | ICD-10-CM

## 2016-04-07 DIAGNOSIS — R011 Cardiac murmur, unspecified: Secondary | ICD-10-CM | POA: Diagnosis not present

## 2016-04-07 DIAGNOSIS — R002 Palpitations: Secondary | ICD-10-CM

## 2016-04-07 DIAGNOSIS — E785 Hyperlipidemia, unspecified: Secondary | ICD-10-CM | POA: Diagnosis not present

## 2016-04-07 NOTE — Progress Notes (Signed)
[facility]. [address] [place], [place]  [address] Phone: [phone] Fax:  [phone]  Date:  04/07/2016   ID:  Wanda Barber, DOB [birth year], MRN [id number]  PCP:  Gerrit Heck, MD   History of Present Illness: Wanda Barber is a 60 y.o. female with hyperlipidemia status post NMR lipid profile with LDL particle number 796-optimal, small LDL particle less than 90-optimal, LDL-C-84, HDL 72, triglycerides 51 - excellent profile here for evaluation of hyperlipidemia as well as family history of CAD.   She has no prior cardiovascular history. She was evaluated with sleep study. She was referred to ENT for snoring - no OSA.  Nuclear stress test December 2010 reassuring, low risk. Walks 3 miles 4 times a week, 30 min. Also does yoga.  At night she notes when laying on side, she feels palpitations, skips. Not during day. Sits at work and she does not notice. LDL less than 100.  Not taking aspirin. She does take fish oil. No bleeding.   Wt Readings from Last 3 Encounters:  04/07/16 132 lb 6.4 oz (60.1 kg)  11/19/15 128 lb (58.1 kg)  09/15/15 128 lb (58.1 kg)     Past Medical History:  Diagnosis Date  . Allergy    SEASONAL  . Anxiety   . Hiatal hernia   . Hyperlipidemia   . Schatzki's ring   . Thyroid nodule     Past Surgical History:  Procedure Laterality Date  . COLONOSCOPY    . DILATION AND CURETTAGE OF UTERUS  1991   due to miscariage    Current Outpatient Prescriptions  Medication Sig Dispense Refill  . ALPRAZolam (NIRAVAM) 0.25 MG dissolvable tablet Take 0.25 mg by mouth at bedtime as needed for anxiety.    Marland Kitchen atorvastatin (LIPITOR) 20 MG tablet Take 20 mg by mouth daily.    . cholecalciferol (VITAMIN D) 1000 UNITS tablet Take 1,000 Units by mouth daily.    . fish oil-omega-3 fatty acids 1000 MG capsule Take 1 g by mouth daily.    . Multiple Vitamins-Minerals (MULTIVITAMIN WITH MINERALS) tablet Take 1 tablet by mouth daily.    .  pantoprazole (PROTONIX) 40 MG tablet Take 1 tablet (40 mg total) by mouth daily. 90 tablet 2   No current facility-administered medications for this visit.     Allergies:    Allergies  Allergen Reactions  . Paxil [Paroxetine Hcl] Other (See Comments)    Decreased appetite, sleep problems    Social History:  The patient  reports that she has never smoked. She does not have any smokeless tobacco history on file. She reports that she drinks about 0.6 oz of alcohol per week . She reports that she does not use drugs.   ROS:  Please see the history of present illness.   Denies any syncope, bleeding, orthopnea, PND   PHYSICAL EXAM: VS:  BP 120/70 (Patient Position: Sitting, Cuff Size: Normal)   Pulse 68   Ht 5\' 4"  (1.626 m)   Wt 132 lb 6.4 oz (60.1 kg)   BMI 22.73 kg/m  Well nourished, well developed, in no acute distress  HEENT: normal  Neck: no JVD  Cardiac:  normal S1, S2; RRR; soft 1/6 systolic murmur  Lungs:  clear to auscultation bilaterally, no wheezing, rhonchi or rales  Abd: soft, nontender, no hepatomegaly  Ext: no edema  Skin: warm and dry  Neuro: no focal abnormalities noted  EKG:  Today ordered-04/07/16-sinus rhythm, 69, no other abnormalities. 02/04/15-normal  sinus rhythm, possible left atrial enlargement, nonspecific ST-T wave flattening. Prior EKG showed sinus rhythm with no other changes     ASSESSMENT AND PLAN:  1. Hyperlipidemia-doing well on atorvastatin. Prior NMR profile reviewed. Reassuring. Continue with diet, exercise. Primary prevention. Father with coronary artery disease. 2. Heart murmur-soft systolic murmur 1/6 heard right upper sternal border. We will check echocardiogram. His been several years since last evaluation. 3. Palpitations-newly described. Way that she is describing laying on left or right side in the middle of the night and feeling an occasional pause like beat, these are suggestive of PVCs or PACs. On side, ready to go to sleep.  EKG today  reassuring. Lab work was reassuring. TSH reassuring. If these worsen, have low threshold for 24 or 48 hour Holter monitor. Right now I would not give her any beta blockers or calcium channel blockers. Conservative management. No high risk symptoms. 4. Snoring - had sleep study, no apnea. She previously asked about correlation with snoring and carotid artery disease. Continue with aggressive primary prevention. No bruits heard on exam. I'm fine with her not taking aspirin for prevention if she wishes. Family history of heart disease. 5. One-year follow-up.    Signed, Candee Furbish, MD [facility]  04/07/2016 3:58 PM

## 2016-04-07 NOTE — Patient Instructions (Signed)

## 2016-05-08 ENCOUNTER — Ambulatory Visit (HOSPITAL_COMMUNITY): Payer: 59 | Attending: Cardiology

## 2016-05-08 ENCOUNTER — Other Ambulatory Visit: Payer: Self-pay

## 2016-05-08 DIAGNOSIS — I340 Nonrheumatic mitral (valve) insufficiency: Secondary | ICD-10-CM | POA: Insufficient documentation

## 2016-05-08 DIAGNOSIS — I071 Rheumatic tricuspid insufficiency: Secondary | ICD-10-CM | POA: Diagnosis not present

## 2016-05-08 DIAGNOSIS — E785 Hyperlipidemia, unspecified: Secondary | ICD-10-CM | POA: Diagnosis not present

## 2016-05-08 DIAGNOSIS — R011 Cardiac murmur, unspecified: Secondary | ICD-10-CM | POA: Diagnosis not present

## 2016-05-08 DIAGNOSIS — I429 Cardiomyopathy, unspecified: Secondary | ICD-10-CM | POA: Diagnosis present

## 2016-05-08 DIAGNOSIS — R002 Palpitations: Secondary | ICD-10-CM | POA: Insufficient documentation

## 2016-05-12 ENCOUNTER — Telehealth: Payer: Self-pay | Admitting: Cardiology

## 2016-05-12 NOTE — Telephone Encounter (Signed)
Patient called with echo results. Voiced understanding.   Asked about MVP - states she was told she had this years ago. Informed her that this was not detected on her recent echo.

## 2016-05-12 NOTE — Telephone Encounter (Signed)
New message     Pt returning nurse call to receive her test results. Please call.

## 2016-06-19 ENCOUNTER — Other Ambulatory Visit: Payer: 59

## 2016-06-22 ENCOUNTER — Ambulatory Visit
Admission: RE | Admit: 2016-06-22 | Discharge: 2016-06-22 | Disposition: A | Discharge status: Home / Self Care | Payer: 59 | Source: Ambulatory Visit | Attending: Family Medicine | Admitting: Family Medicine

## 2016-06-22 DIAGNOSIS — E041 Nontoxic single thyroid nodule: Secondary | ICD-10-CM

## 2016-08-29 ENCOUNTER — Other Ambulatory Visit: Payer: Self-pay | Admitting: Internal Medicine

## 2016-10-05 DIAGNOSIS — Z1231 Encounter for screening mammogram for malignant neoplasm of breast: Secondary | ICD-10-CM | POA: Diagnosis not present

## 2016-10-05 DIAGNOSIS — Z01419 Encounter for gynecological examination (general) (routine) without abnormal findings: Secondary | ICD-10-CM | POA: Diagnosis not present

## 2016-10-12 ENCOUNTER — Other Ambulatory Visit: Payer: Self-pay | Admitting: Internal Medicine

## 2017-03-26 DIAGNOSIS — Z0000 Encounter for general adult medical examination without abnormal findings: Secondary | ICD-10-CM | POA: Diagnosis not present

## 2017-03-26 DIAGNOSIS — E785 Hyperlipidemia, unspecified: Secondary | ICD-10-CM | POA: Diagnosis not present

## 2017-07-09 ENCOUNTER — Other Ambulatory Visit: Payer: Self-pay | Admitting: Internal Medicine

## 2017-07-12 ENCOUNTER — Encounter: Payer: Self-pay | Admitting: Cardiology

## 2017-07-12 ENCOUNTER — Ambulatory Visit (INDEPENDENT_AMBULATORY_CARE_PROVIDER_SITE_OTHER): Payer: 59 | Admitting: Cardiology

## 2017-07-12 ENCOUNTER — Encounter (INDEPENDENT_AMBULATORY_CARE_PROVIDER_SITE_OTHER): Payer: Self-pay

## 2017-07-12 VITALS — BP 120/74 | HR 67 | Ht 64.0 in | Wt 133.0 lb

## 2017-07-12 DIAGNOSIS — E7800 Pure hypercholesterolemia, unspecified: Secondary | ICD-10-CM | POA: Diagnosis not present

## 2017-07-12 DIAGNOSIS — R002 Palpitations: Secondary | ICD-10-CM

## 2017-07-12 DIAGNOSIS — I340 Nonrheumatic mitral (valve) insufficiency: Secondary | ICD-10-CM

## 2017-07-12 DIAGNOSIS — Z8249 Family history of ischemic heart disease and other diseases of the circulatory system: Secondary | ICD-10-CM

## 2017-07-12 NOTE — Patient Instructions (Signed)
Medication Instructions:  The current medical regimen is effective;  continue present plan and medications.  Follow-Up: Follow up in 2 years with Dr. Skains.  You will receive a letter in the mail 2 months before you are due.  Please call us when you receive this letter to schedule your follow up appointment.  If you need a refill on your cardiac medications before your next appointment, please call your pharmacy.  Thank you for choosing [facility]!!     

## 2017-07-12 NOTE — Progress Notes (Signed)
[facility]. [address], [address] [place], [place]  [address] Phone: [phone] Fax:  [phone]  Date:  07/12/2017   ID:  Wanda Barber, DOB 23-Nov-1955, MRN [id number]  PCP:  Leighton Ruff, MD   History of Present Illness: Wanda Barber is a 61 y.o. female with hyperlipidemia status post NMR lipid profile with LDL particle number 796-optimal, small LDL particle less than 90-optimal, LDL-C-84, HDL 72, triglycerides 51 - excellent profile here for evaluation of hyperlipidemia as well as family history of CAD.   She has no prior cardiovascular history. She was evaluated with sleep study. She was referred to ENT for snoring - no OSA.  Nuclear stress test December 2010 reassuring, low risk. Walks 3 miles 4 times a week, 30 min. Also does yoga.  At night she notes when laying on side, she feels palpitations, skips. Not during day. Sits at work and she does not notice. LDL less than 100.  Not taking aspirin. She does take fish oil. No bleeding.   Wt Readings from Last 3 Encounters:  07/12/17 133 lb (60.3 kg)  04/07/16 132 lb 6.4 oz (60.1 kg)  11/19/15 128 lb (58.1 kg)     Past Medical History:  Diagnosis Date  . Allergy    SEASONAL  . Anxiety   . Hiatal hernia   . Hyperlipidemia   . Schatzki's ring   . Thyroid nodule     Past Surgical History:  Procedure Laterality Date  . COLONOSCOPY    . DILATION AND CURETTAGE OF UTERUS  1991   due to miscariage    Current Outpatient Prescriptions  Medication Sig Dispense Refill  . ALPRAZolam (NIRAVAM) 0.25 MG dissolvable tablet Take 0.25 mg by mouth at bedtime as needed for anxiety.    Marland Kitchen atorvastatin (LIPITOR) 20 MG tablet Take 20 mg by mouth daily.    . cholecalciferol (VITAMIN D) 1000 UNITS tablet Take 1,000 Units by mouth daily.    . Cyanocobalamin (VITAMIN B-12) 500 MCG LOZG Take 2 lozenges by mouth daily.    . fish oil-omega-3 fatty acids 1000 MG capsule Take 1 g by mouth daily.    . Multiple Vitamins-Minerals  (MULTIVITAMIN WITH MINERALS) tablet Take 1 tablet by mouth daily.    . pantoprazole (PROTONIX) 40 MG tablet TAKE 1 TABLET BY MOUTH EVERY DAY 90 tablet 0   No current facility-administered medications for this visit.     Allergies:    Allergies  Allergen Reactions  . Paxil [Paroxetine Hcl] Other (See Comments)    Decreased appetite, sleep problems    Social History:  The patient  reports that she has never smoked. She has never used smokeless tobacco. She reports that she drinks about 0.6 oz of alcohol per week . She reports that she does not use drugs.   ROS:  Please see the history of present illness.   Denies any syncope, bleeding, orthopnea, PND   PHYSICAL EXAM: VS:  BP 120/74   Pulse 67   Ht 5\' 4"  (1.626 m)   Wt 133 lb (60.3 kg)   SpO2 98%   BMI 22.83 kg/m  Well nourished, well developed, in no acute distress  HEENT: normal  Neck: no JVD  Cardiac:  normal S1, S2; RRR; soft 1/6 systolic murmur  Lungs:  clear to auscultation bilaterally, no wheezing, rhonchi or rales  Abd: soft, nontender, no hepatomegaly  Ext: no edema  Skin: warm and dry  Neuro: no focal abnormalities noted  EKG:  Today ordered-  07/12/17-sinus rhythm 65 no other abnormalities.  04/07/16-sinus rhythm, 69, no other abnormalities. 02/04/15-normal sinus rhythm, possible left atrial enlargement, nonspecific ST-T wave flattening. Prior EKG showed sinus rhythm with no other changes     ECHO 05/08/16:  - Left ventricle: The cavity size was normal. Wall thickness was   normal. Systolic function was normal. The estimated ejection   fraction was in the range of 60% to 65%. Wall motion was normal;   there were no regional wall motion abnormalities. - Mitral valve: There was mild regurgitation. - Tricuspid valve: There was trivial regurgitation. - Pulmonary arteries: Systolic pressure was mildly increased. PA   peak pressure: 37 mm Hg (S).  ASSESSMENT AND PLAN:  1. Hyperlipidemia-she admits that she had stopped  her atorvastatin and her cholesterol levels had increased.  Dr. Drema Dallas is rechecking.  Prior NMR profile reviewed.  Continue with diet, exercise. Primary prevention. Father with coronary artery disease. 2. Mitral regurgitation mild/heart murmur-soft systolic murmur 1/6 heard right upper sternal border. 3. Palpitations-stable.  way that she is describing laying on left or right side in the middle of the night and feeling an occasional pause like beat, these are suggestive of PVCs or PACs. On side, ready to go to sleep.  EKG today reassuring. Lab work was reassuring. TSH reassuring. If these worsen, have low threshold for 24 or 48 hour Holter monitor. Right now I would not give her any beta blockers or calcium channel blockers. Conservative management. No high risk symptoms.  Stable. 4. Snoring - had sleep study, no apnea. She previously asked about correlation with snoring and carotid artery disease. Continue with aggressive primary prevention. No bruits heard on exam. I'm fine with her not taking aspirin for prevention if she wishes.  Understand bleeding risks.  Family history of heart disease. 5. 2-year follow-up.    Signed, Candee Furbish, MD [facility]  07/12/2017 3:57 PM

## 2017-07-20 DIAGNOSIS — E7800 Pure hypercholesterolemia, unspecified: Secondary | ICD-10-CM | POA: Diagnosis not present

## 2017-07-20 DIAGNOSIS — R7989 Other specified abnormal findings of blood chemistry: Secondary | ICD-10-CM | POA: Diagnosis not present

## 2017-08-27 DIAGNOSIS — H2513 Age-related nuclear cataract, bilateral: Secondary | ICD-10-CM | POA: Diagnosis not present

## 2017-10-10 ENCOUNTER — Other Ambulatory Visit: Payer: Self-pay | Admitting: Internal Medicine

## 2017-10-15 IMAGING — US US THYROID
1 series · 14 of 25 positions shown · non-contrast
Comparison: 03/23/2014

CLINICAL DATA: 60-year-old female with a history of nodule
follow-up

EXAM:
THYROID ULTRASOUND
TECHNIQUE: Ultrasound examination of the thyroid gland and adjacent soft
tissues was performed.

[Series 1: us thyroid · 0.06mm/px · 14 of 31 slices shown]
[im 1/31]
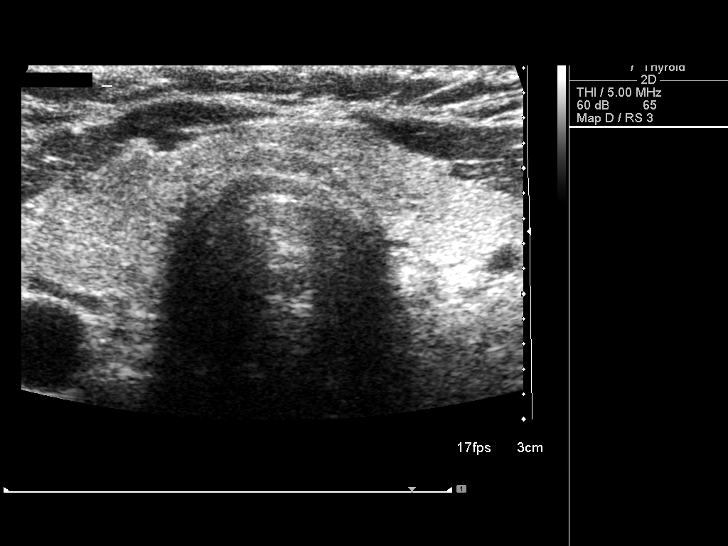
[im 3/31]
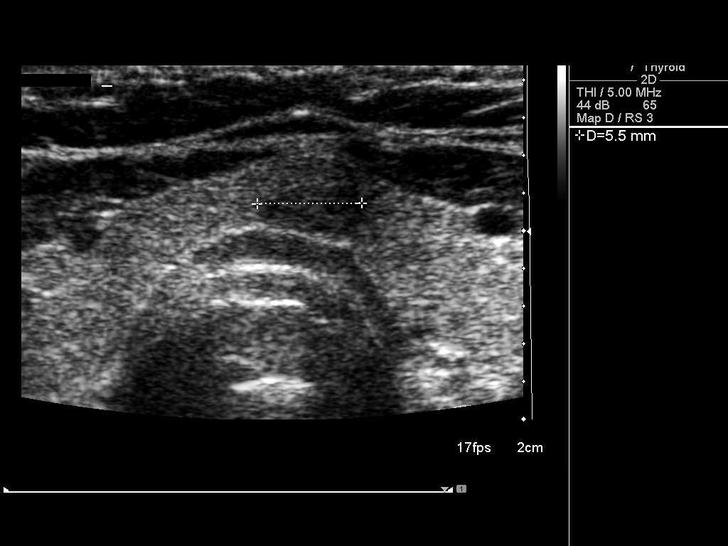
[im 6/31]
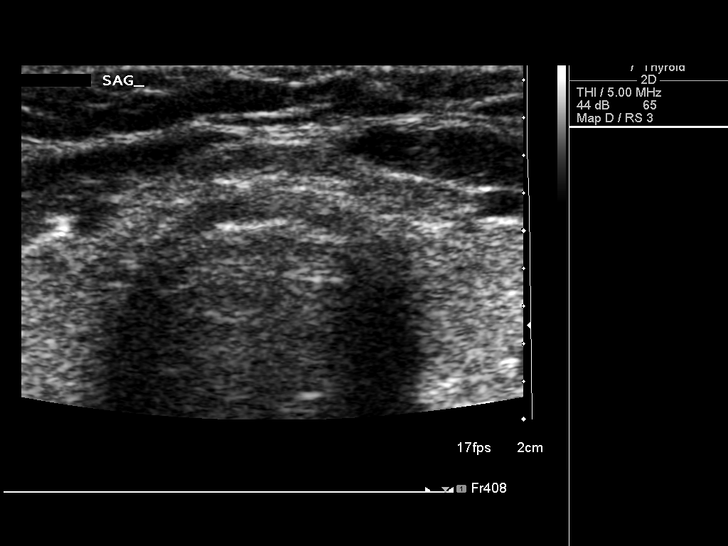
[im 8/31]
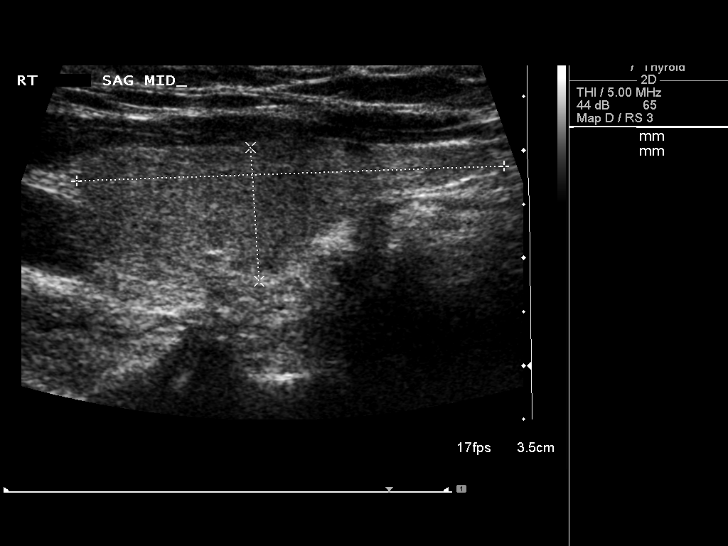
[im 11/31]
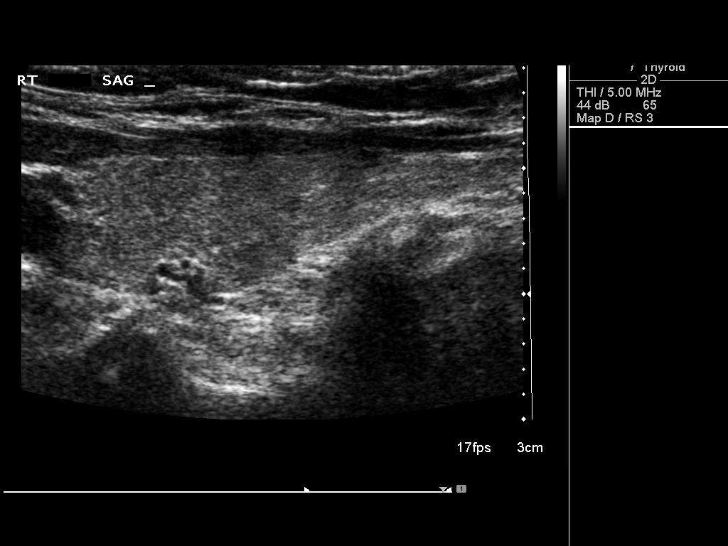
[im 12/31]
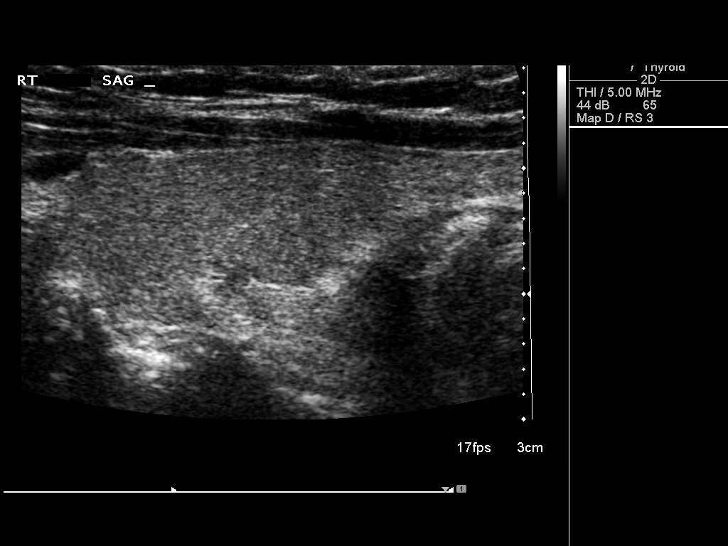
[im 14/31]
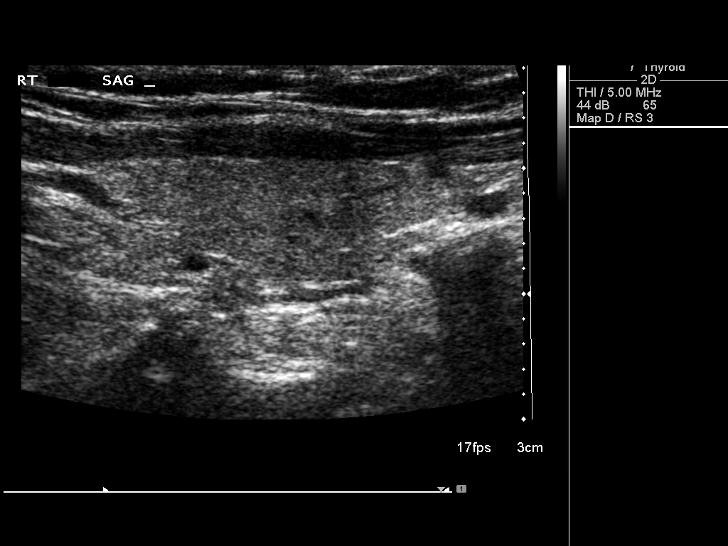
[im 17/31]
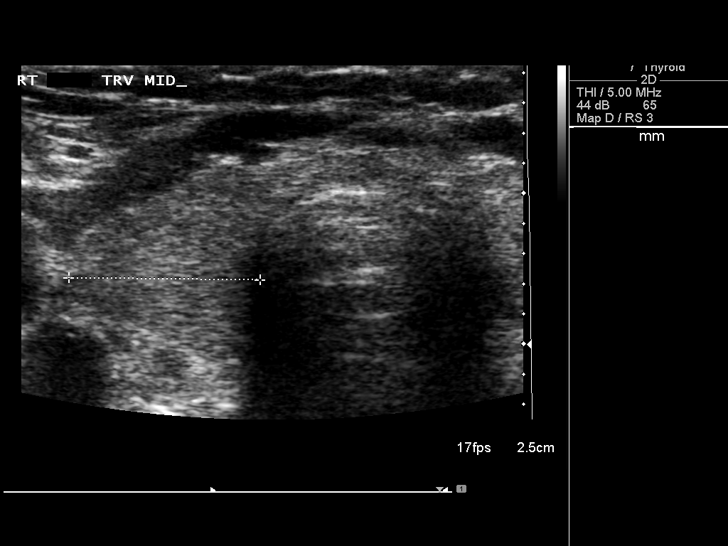
[im 19/31]
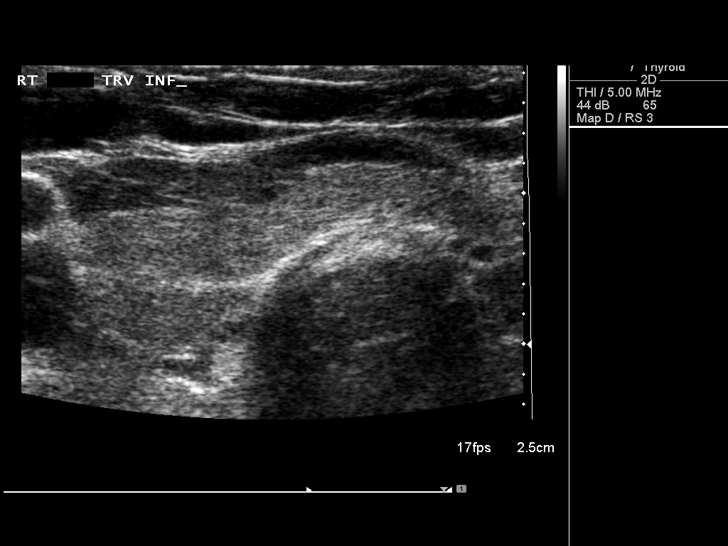
[im 21/31]
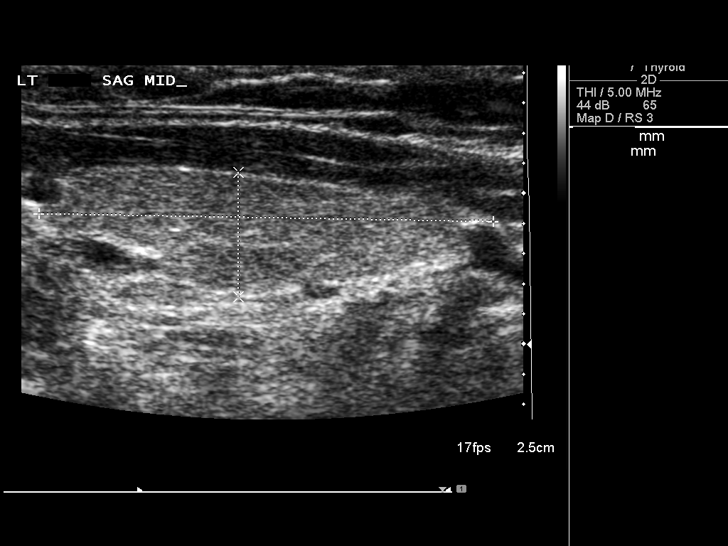
[im 23/31]
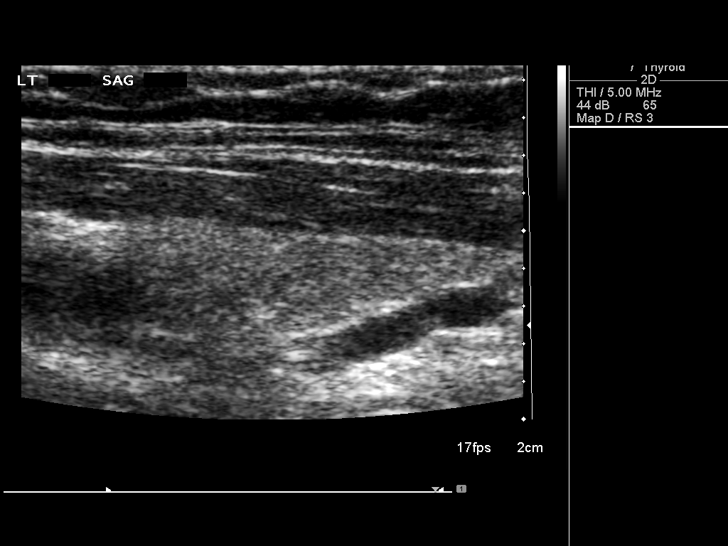
[im 26/31]
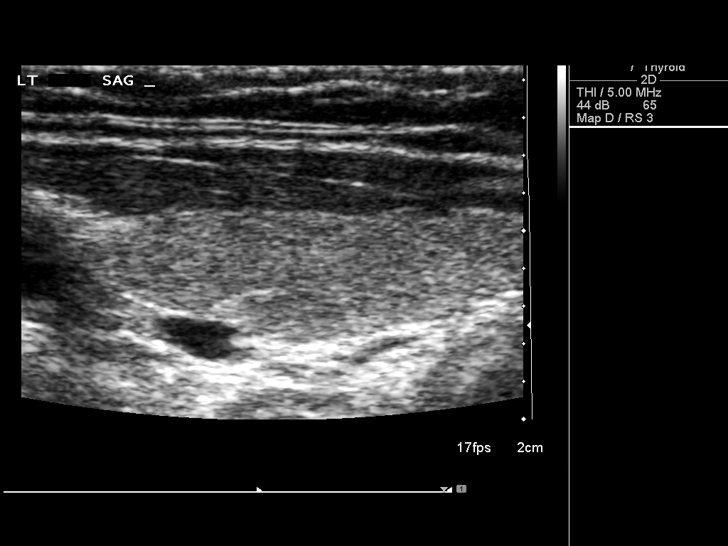
[im 28/31]
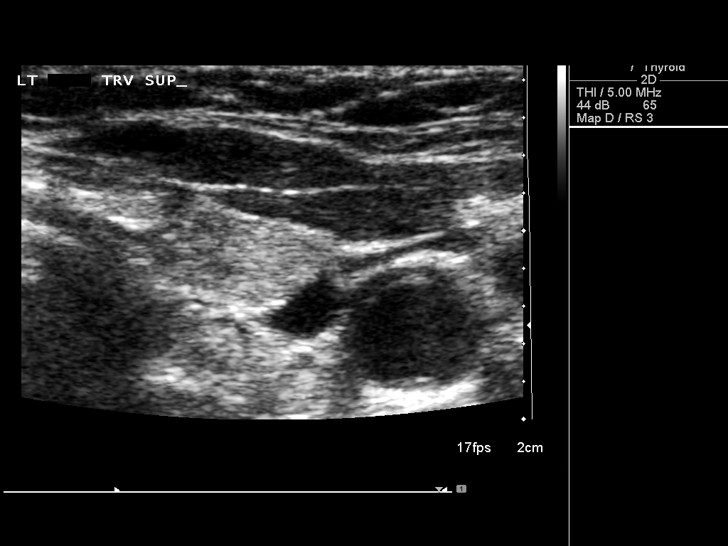
[im 31/31]
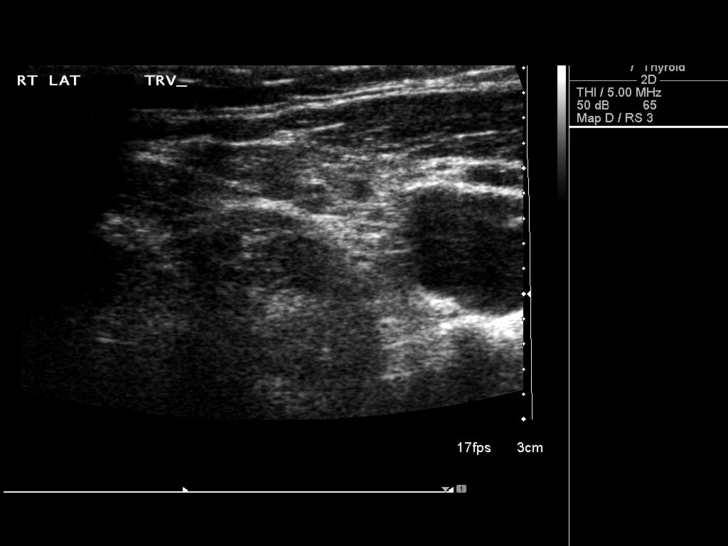

[14 of 25 positions shown; findings below may reference images not displayed]

FINDINGS: Right thyroid lobe

Measurements: 4.0 cm x 1.2 cm x 1.3 cm. Heterogeneous appearance of
the right thyroid with increased internal flow.

Left thyroid lobe

Measurements: 3.0 cm x 0.8 cm x 1.3 cm. Heterogeneous appearance of
left thyroid tissue.

Left-sided isthmic nodule measures 5 mm x 3 mm x 6 mm, hypoechoic
with solid features and relatively unchanged from the comparison.

Isthmus

Thickness: 4 mm.

Lymphadenopathy

None visualized.
IMPRESSION: Heterogeneous thyroid with relatively increased internal flow,
compatible with spectrum of thyroiditis.

Unchanged isthmic nodule.

## 2017-11-30 DIAGNOSIS — Z1231 Encounter for screening mammogram for malignant neoplasm of breast: Secondary | ICD-10-CM | POA: Diagnosis not present

## 2018-03-29 DIAGNOSIS — Z0000 Encounter for general adult medical examination without abnormal findings: Secondary | ICD-10-CM | POA: Diagnosis not present

## 2018-03-29 DIAGNOSIS — E041 Nontoxic single thyroid nodule: Secondary | ICD-10-CM | POA: Diagnosis not present

## 2018-03-29 DIAGNOSIS — E7800 Pure hypercholesterolemia, unspecified: Secondary | ICD-10-CM | POA: Diagnosis not present

## 2018-05-15 DIAGNOSIS — I788 Other diseases of capillaries: Secondary | ICD-10-CM | POA: Diagnosis not present

## 2018-05-15 DIAGNOSIS — L570 Actinic keratosis: Secondary | ICD-10-CM | POA: Diagnosis not present

## 2018-05-15 DIAGNOSIS — L821 Other seborrheic keratosis: Secondary | ICD-10-CM | POA: Diagnosis not present

## 2018-05-17 ENCOUNTER — Other Ambulatory Visit: Payer: Self-pay | Admitting: Internal Medicine

## 2018-06-14 DIAGNOSIS — E039 Hypothyroidism, unspecified: Secondary | ICD-10-CM | POA: Diagnosis not present

## 2018-10-07 DIAGNOSIS — E7800 Pure hypercholesterolemia, unspecified: Secondary | ICD-10-CM | POA: Diagnosis not present

## 2018-10-07 DIAGNOSIS — G479 Sleep disorder, unspecified: Secondary | ICD-10-CM | POA: Diagnosis not present

## 2018-10-07 DIAGNOSIS — F419 Anxiety disorder, unspecified: Secondary | ICD-10-CM | POA: Diagnosis not present

## 2018-10-07 DIAGNOSIS — E039 Hypothyroidism, unspecified: Secondary | ICD-10-CM | POA: Diagnosis not present

## 2018-10-15 DIAGNOSIS — H1859 Other hereditary corneal dystrophies: Secondary | ICD-10-CM | POA: Diagnosis not present

## 2018-10-21 DIAGNOSIS — D1801 Hemangioma of skin and subcutaneous tissue: Secondary | ICD-10-CM | POA: Diagnosis not present

## 2018-10-21 DIAGNOSIS — L570 Actinic keratosis: Secondary | ICD-10-CM | POA: Diagnosis not present

## 2018-10-21 DIAGNOSIS — L821 Other seborrheic keratosis: Secondary | ICD-10-CM | POA: Diagnosis not present

## 2019-02-07 DIAGNOSIS — Z01419 Encounter for gynecological examination (general) (routine) without abnormal findings: Secondary | ICD-10-CM | POA: Diagnosis not present

## 2019-02-07 DIAGNOSIS — Z124 Encounter for screening for malignant neoplasm of cervix: Secondary | ICD-10-CM | POA: Diagnosis not present

## 2019-02-07 DIAGNOSIS — Z6822 Body mass index (BMI) 22.0-22.9, adult: Secondary | ICD-10-CM | POA: Diagnosis not present

## 2019-02-07 DIAGNOSIS — Z1231 Encounter for screening mammogram for malignant neoplasm of breast: Secondary | ICD-10-CM | POA: Diagnosis not present

## 2019-02-10 ENCOUNTER — Other Ambulatory Visit: Payer: Self-pay | Admitting: *Deleted

## 2019-02-11 ENCOUNTER — Other Ambulatory Visit: Payer: Self-pay | Admitting: Obstetrics and Gynecology

## 2019-02-11 DIAGNOSIS — R928 Other abnormal and inconclusive findings on diagnostic imaging of breast: Secondary | ICD-10-CM

## 2019-02-14 ENCOUNTER — Ambulatory Visit
Admission: RE | Admit: 2019-02-14 | Discharge: 2019-02-14 | Disposition: A | Discharge status: Home / Self Care | Payer: 59 | Source: Ambulatory Visit | Attending: Obstetrics and Gynecology | Admitting: Obstetrics and Gynecology

## 2019-02-14 ENCOUNTER — Other Ambulatory Visit: Payer: Self-pay

## 2019-02-14 DIAGNOSIS — N6011 Diffuse cystic mastopathy of right breast: Secondary | ICD-10-CM | POA: Diagnosis not present

## 2019-02-14 DIAGNOSIS — R928 Other abnormal and inconclusive findings on diagnostic imaging of breast: Secondary | ICD-10-CM

## 2019-07-07 ENCOUNTER — Encounter: Payer: Self-pay | Admitting: Cardiology

## 2019-07-08 ENCOUNTER — Ambulatory Visit: Payer: BC Managed Care – PPO | Admitting: Cardiology

## 2019-08-01 DIAGNOSIS — E7800 Pure hypercholesterolemia, unspecified: Secondary | ICD-10-CM | POA: Diagnosis not present

## 2019-08-01 DIAGNOSIS — Z79899 Other long term (current) drug therapy: Secondary | ICD-10-CM | POA: Diagnosis not present

## 2019-08-01 DIAGNOSIS — E039 Hypothyroidism, unspecified: Secondary | ICD-10-CM | POA: Diagnosis not present

## 2019-08-11 DIAGNOSIS — E039 Hypothyroidism, unspecified: Secondary | ICD-10-CM | POA: Diagnosis not present

## 2019-08-11 DIAGNOSIS — F419 Anxiety disorder, unspecified: Secondary | ICD-10-CM | POA: Diagnosis not present

## 2019-08-11 DIAGNOSIS — K219 Gastro-esophageal reflux disease without esophagitis: Secondary | ICD-10-CM | POA: Diagnosis not present

## 2019-08-11 DIAGNOSIS — E7800 Pure hypercholesterolemia, unspecified: Secondary | ICD-10-CM | POA: Diagnosis not present

## 2019-08-12 ENCOUNTER — Other Ambulatory Visit: Payer: Self-pay

## 2019-08-12 ENCOUNTER — Encounter: Payer: Self-pay | Admitting: Cardiology

## 2019-08-12 ENCOUNTER — Ambulatory Visit: Payer: BC Managed Care – PPO | Admitting: Cardiology

## 2019-08-12 VITALS — BP 160/90 | HR 69 | Ht 64.0 in | Wt 126.0 lb

## 2019-08-12 DIAGNOSIS — R002 Palpitations: Secondary | ICD-10-CM | POA: Diagnosis not present

## 2019-08-12 DIAGNOSIS — R030 Elevated blood-pressure reading, without diagnosis of hypertension: Secondary | ICD-10-CM

## 2019-08-12 DIAGNOSIS — Z8249 Family history of ischemic heart disease and other diseases of the circulatory system: Secondary | ICD-10-CM | POA: Diagnosis not present

## 2019-08-12 NOTE — Patient Instructions (Signed)
Medication Instructions:  The current medical regimen is effective;  continue present plan and medications.  *If you need a refill on your cardiac medications before your next appointment, please call your pharmacy*  Follow-Up: At [facility], you and your health needs are our priority.  As part of our continuing mission to provide you with exceptional heart care, we have created designated Provider Care Teams.  These Care Teams include your primary Cardiologist (physician) and Advanced Practice Providers (APPs -  Physician Assistants and Nurse Practitioners) who all work together to provide you with the care you need, when you need it.  Your next appointment:   2 year(s)  The format for your next appointment:   In Person  Provider:   Candee Furbish, MD  Thank you for choosing [facility]!!    Please obtain an [employer] blood pressure cuff.  Keep a diary of blood pressures for a couple of weeks and then send them to Dr Marlou Porch through [facility].

## 2019-08-12 NOTE — Progress Notes (Signed)
[place]. [address] [place], [place]  [address] Phone: [phone] Fax:  [phone]  Date:  08/12/2019   ID:  Wanda Barber, DOB 07-Jul-1956, MRN [id number]  PCP:  Leighton Ruff, MD   History of Present Illness: Wanda Barber is a 63 y.o. female with hyperlipidemia status post NMR lipid profile with LDL particle number 796-optimal, small LDL particle less than 90-optimal, LDL-C-84, HDL 72, triglycerides 51 - excellent profile here for evaluation of hyperlipidemia as well as family history of CAD.   She has no prior cardiovascular history. She was evaluated with sleep study. She was referred to ENT for snoring - no OSA.  Nuclear stress test December 2010 reassuring, low risk. Walks 3 miles 4 times a week, 30 min. Also does yoga.  At night she notes when laying on side, she feels palpitations, skips. Not during day. Sits at work and she does not notice. LDL less than 100.  Not taking aspirin. She does take fish oil. No bleeding.  08/12/2019  - here for follow up HL. Normal BP 114 previously. Yesterday, blood work OK, Dr. Drema Dallas.  She has been walking, exercising.  Low salt.  She does have 1+ of wine at night.  Her blood pressure today was elevated 160/90.  On repeat same.  I have asked her to purchase an Omron monitor for further updates.   Wt Readings from Last 3 Encounters:  08/12/19 126 lb (57.2 kg)  07/12/17 133 lb (60.3 kg)  04/07/16 132 lb 6.4 oz (60.1 kg)     Past Medical History:  Diagnosis Date  . Allergy    SEASONAL  . Anxiety   . Hiatal hernia   . Hyperlipidemia   . Schatzki's ring   . Thyroid nodule     Past Surgical History:  Procedure Laterality Date  . COLONOSCOPY    . DILATION AND CURETTAGE OF UTERUS  1991   due to miscariage    Current Outpatient Medications  Medication Sig Dispense Refill  . ALPRAZolam (NIRAVAM) 0.25 MG dissolvable tablet Take 0.25 mg by mouth at bedtime as needed for anxiety.    Marland Kitchen atorvastatin (LIPITOR) 20  MG tablet Take 20 mg by mouth daily.    . cholecalciferol (VITAMIN D) 1000 UNITS tablet Take 1,000 Units by mouth daily.    . Cyanocobalamin (VITAMIN B-12) 500 MCG LOZG Take 2 lozenges by mouth daily.    . fish oil-omega-3 fatty acids 1000 MG capsule Take 1 g by mouth daily.    . Multiple Vitamins-Minerals (MULTIVITAMIN WITH MINERALS) tablet Take 1 tablet by mouth daily.    . pantoprazole (PROTONIX) 40 MG tablet TAKE 1 TABLET BY MOUTH EVERY DAY 90 tablet 0   No current facility-administered medications for this visit.     Allergies:    Allergies  Allergen Reactions  . Paxil [Paroxetine Hcl] Other (See Comments)    Decreased appetite, sleep problems    Social History:  The patient  reports that she has never smoked. She has never used smokeless tobacco. She reports current alcohol use of about 1.0 standard drinks of alcohol per week. She reports that she does not use drugs.   ROS:  Please see the history of present illness.   Denies any syncope, bleeding, orthopnea, PND   PHYSICAL EXAM: VS:  BP (!) 160/90   Pulse 69   Ht 5\' 4"  (1.626 m)   Wt 126 lb (57.2 kg)   SpO2 99%   BMI 21.63 kg/m  GEN: Well nourished, well developed, in no acute distress  HEENT: normal  Neck: no JVD, carotid bruits, or masses Cardiac: RRR; no murmurs, rubs, or gallops,no edema  Respiratory:  clear to auscultation bilaterally, normal work of breathing GI: soft, nontender, nondistended, + BS MS: no deformity or atrophy  Skin: warm and dry, no rash Neuro:  Alert and Oriented x 3, Strength and sensation are intact Psych: euthymic mood, full affect   EKG:  Today ordered-08/12/2019-sinus rhythm 69 with no other abnormalities.  07/12/17-sinus rhythm 65 no other abnormalities.  04/07/16-sinus rhythm, 69, no other abnormalities. 02/04/15-normal sinus rhythm, possible left atrial enlargement, nonspecific ST-T wave flattening. Prior EKG showed sinus rhythm with no other changes     ECHO 05/08/16:  - Left ventricle:  The cavity size was normal. Wall thickness was   normal. Systolic function was normal. The estimated ejection   fraction was in the range of 60% to 65%. Wall motion was normal;   there were no regional wall motion abnormalities. - Mitral valve: There was mild regurgitation. - Tricuspid valve: There was trivial regurgitation. - Pulmonary arteries: Systolic pressure was mildly increased. PA   peak pressure: 37 mm Hg (S).  ASSESSMENT AND PLAN:  1. Hyperlipidemia-atorvastatin 20 mg a day-LDL 77.  Primary prevention. Father with coronary artery disease.  Excellent use of atorvastatin. 2. Elevated blood pressure without hypertension -we will continue to monitor.  I have requested that she purchase an Omron blood pressure cuff and periodically check at the kitchen table at home.  She does not eat salt.  She is exercising.  Her current blood pressure 160/90 may be situational. 3. Mitral regurgitation-very mild on prior echocardiogram.  I do not appreciate a murmur today.  No clinical changes. 4. Palpitations-stable.  Way that she is describing laying on left or right side in the middle of the night and feeling an occasional pause like beat, these are suggestive of PVCs or PACs. On side, ready to go to sleep.  EKG today once again reassuring. Lab work was reassuring. TSH reassuring. Conservative management. No high risk symptoms.  Stable. 5. Snoring - had prior sleep study, no apnea.  6. 2-year follow-up.  Before that, I would like her to meshes me some blood pressure readings in the next month or 2 with her new Omron.    Signed, Candee Furbish, MD [facility]  08/12/2019 5:16 PM

## 2019-09-25 ENCOUNTER — Other Ambulatory Visit: Payer: BC Managed Care – PPO

## 2019-10-14 NOTE — Telephone Encounter (Signed)
BP still a little high.  Let's have her set up with APP (can be virtual) to discuss ARB.   Thanks   Candee Furbish, MD

## 2019-10-21 ENCOUNTER — Telehealth (INDEPENDENT_AMBULATORY_CARE_PROVIDER_SITE_OTHER): Payer: BC Managed Care – PPO | Admitting: Cardiology

## 2019-10-21 ENCOUNTER — Encounter: Payer: Self-pay | Admitting: Cardiology

## 2019-10-21 ENCOUNTER — Other Ambulatory Visit: Payer: Self-pay

## 2019-10-21 VITALS — BP 168/91 | HR 60 | Ht 64.0 in | Wt 126.0 lb

## 2019-10-21 DIAGNOSIS — I10 Essential (primary) hypertension: Secondary | ICD-10-CM | POA: Diagnosis not present

## 2019-10-21 DIAGNOSIS — Z5189 Encounter for other specified aftercare: Secondary | ICD-10-CM

## 2019-10-21 DIAGNOSIS — R002 Palpitations: Secondary | ICD-10-CM

## 2019-10-21 MED ORDER — LOSARTAN POTASSIUM 50 MG PO TABS: 50.0000 mg | ORAL_TABLET | Freq: Every day | ORAL | 3 refills | Status: DC

## 2019-10-21 NOTE — Progress Notes (Signed)
Virtual Visit via Telephone Note   This visit type was conducted due to national recommendations for restrictions regarding the COVID-19 Pandemic (e.g. social distancing) in an effort to limit this patient's exposure and mitigate transmission in our community.  Due to her co-morbid illnesses, this patient is at least at moderate risk for complications without adequate follow up.  This format is felt to be most appropriate for this patient at this time.  The patient did not have access to video technology/had technical difficulties with video requiring transitioning to audio format only (telephone).  All issues noted in this document were discussed and addressed.  No physical exam could be performed with this format.  Please refer to the patient's chart for her  consent to telehealth for [facility].   Date:  10/21/2019   ID:  Wanda Barber, DOB 1956/04/13, MRN [id number]  Patient Location: Home Provider Location: Office  PCP:  Leighton Ruff, MD  Cardiologist:  Candee Furbish, MD  Electrophysiologist:  None   Evaluation Performed:  Follow-Up Visit  Chief Complaint:  HTN  History of Present Illness:    Wanda Barber is a 64 y.o. female with recent HTN.   She has a hx of hyperlipidemia status post NMR lipid profile with LDL particle number 796-optimal, small LDL particle less than 90-optimal, LDL-C-84, HDL 72, triglycerides 51 - excellent profile here for evaluation of hyperlipidemia as well as family history of CAD.   She has no prior cardiovascular history. She was evaluated with sleep study. She was referred to ENT for snoring - no OSA.  Nuclear stress test December 2010 reassuring, low risk. Walks 3 miles 4 times a week, 30 min. Also does yoga.  At night she notes when laying on side, she feels palpitations, skips. Not during day. Sits at work and she does not notice. LDL less than 100.  Not taking aspirin. She does take fish oil. No bleeding.  08/12/2019 with Dr.  Marlou Porch  - here for follow up HL. Normal BP 114 previously. Yesterday, blood work OK, Dr. Drema Dallas.  She has been walking, exercising.  Low salt.  She does have 1+ of wine at night.  Her blood pressure today was elevated 160/90.  On repeat same.  I have asked her to purchase an Omron monitor for further updates  Today 10/21/19 Here for bp check eval and on home cecks she is elevated 135/74 to 154/81 plan for ARB.  Cozaar last labs were stable in Nov 2020 - check BMP in 1 week.   No chest pan or SOB, she is concerned about her BP and continued elevation.  She remains active with walking and yoga. She has had more take out over COVID time, though usually does not cook with salt or add to food, but her BP is 162/97 today on third check.  She does feel she has increased stress.    The patient does not have symptoms concerning for COVID-19 infection (fever, chills, cough, or new shortness of breath).    Past Medical History:  Diagnosis Date  . Allergy    SEASONAL  . Anxiety   . Hiatal hernia   . Hyperlipidemia   . Schatzki's ring   . Thyroid nodule    Past Surgical History:  Procedure Laterality Date  . COLONOSCOPY    . DILATION AND CURETTAGE OF UTERUS  1991   due to miscariage     Current Meds  Medication Sig  . ALPRAZolam (NIRAVAM) 0.25 MG dissolvable tablet Take 0.25 mg  by mouth at bedtime as needed for anxiety.  Marland Kitchen atorvastatin (LIPITOR) 20 MG tablet Take 20 mg by mouth daily.  . cholecalciferol (VITAMIN D) 1000 UNITS tablet Take 1,000 Units by mouth daily.  . Cyanocobalamin (VITAMIN B-12) 500 MCG LOZG Take 2 lozenges by mouth daily.  . fish oil-omega-3 fatty acids 1000 MG capsule Take 1 g by mouth daily.  . Multiple Vitamins-Minerals (MULTIVITAMIN WITH MINERALS) tablet Take 1 tablet by mouth daily.  . pantoprazole (PROTONIX) 40 MG tablet TAKE 1 TABLET BY MOUTH EVERY DAY     Allergies:   Paxil [paroxetine hcl]   Social History   Tobacco Use  . Smoking status: Never Smoker  .  Smokeless tobacco: Never Used  . Tobacco comment: Occassionally  Substance Use Topics  . Alcohol use: Yes    Alcohol/week: 1.0 standard drinks    Types: 1 Glasses of wine per week    Comment: glass of wine with dinner  . Drug use: No     Family Hx: The patient's family history includes Asthma in her mother; Fibromyalgia in her sister; Heart disease in her father; Heart failure in her maternal grandmother and mother; Hypertension in her father; Pulmonary fibrosis in her father; Rheum arthritis in her mother. There is no history of Colon cancer.  ROS:   Please see the history of present illness.    General:no colds or fevers, no weight changes Skin:no rashes or ulcers HEENT:no blurred vision, no congestion CV:see HPI PUL:see HPI GI:no diarrhea constipation or melena, no indigestion GU:no hematuria, no dysuria MS:no joint pain, no claudication Neuro:no syncope, no lightheadedness Endo:no diabetes, no thyroid disease  All other systems reviewed and are negative.   Prior CV studies:   The following studies were reviewed today:  Echo 05/08/16 Study Conclusions   - Left ventricle: The cavity size was normal. Wall thickness was  normal. Systolic function was normal. The estimated ejection  fraction was in the range of 60% to 65%. Wall motion was normal;  there were no regional wall motion abnormalities.  - Mitral valve: There was mild regurgitation.  - Tricuspid valve: There was trivial regurgitation.  - Pulmonary arteries: Systolic pressure was mildly increased. PA  peak pressure: 37 mm Hg (S).   Labs/Other Tests and Data Reviewed:    EKG:  An ECG dated 08/12/19 was personally reviewed today and demonstrated:  NSR normal EKG  Recent Labs: No results found for requested labs within last 8760 hours.   Recent Lipid Panel No results found for: CHOL, TRIG, HDL, CHOLHDL, LDLCALC, LDLDIRECT  Wt Readings from Last 3 Encounters:  10/21/19 126 lb (57.2 kg)  08/12/19 126  lb (57.2 kg)  07/12/17 133 lb (60.3 kg)     Objective:    Vital Signs:  BP (!) 168/91   Pulse 60   Ht 5\' 4"  (1.626 m)   Wt 126 lb (57.2 kg)   BMI 21.63 kg/m    VITAL SIGNS:  reviewed  General NAD in voice by Phone Pulmonary can speak in complete sentences without SOB. Neuro A&O X 3  Answers questions approp. Psych: pleasant affect    ASSESSMENT & PLAN:    1. HTN reviewed her home BP checks and BP still 160s freq.  Systolic - will begin cozaar 50 mg daily, and recheck her BMP in a week, last labs her K+ was 5.0 so want to re-eval on medication.  She does have FH of HTN  She will follow up in 3 weeks just to re-eval  BP and virtual is fine.   2. palpitations did not complain of those today.    COVID-19 Education: The signs and symptoms of COVID-19 were discussed with the patient and how to seek care for testing (follow up with PCP or arrange E-visit).  The importance of social distancing was discussed today.  Time:   Today, I have spent 10 minutes with the patient with telehealth technology discussing the above problems.     Medication Adjustments/Labs and Tests Ordered: Current medicines are reviewed at length with the patient today.  Concerns regarding medicines are outlined above.   Tests Ordered: No orders of the defined types were placed in this encounter.   Medication Changes: No orders of the defined types were placed in this encounter.   Follow Up:  Virtual Visit  in 3 week(s)  Signed, Cecilie Kicks, NP  10/21/2019 9:51 AM    [place]

## 2019-10-21 NOTE — Patient Instructions (Signed)
Medication Instructions:  Your physician has recommended you make the following change in your medication:  1. Start Losartan one tablet ( 50 mg ) daily, sent in # 90 to requested pharmacy.   Labwork: Your physician recommends that you return for lab work on Wednesday, February 17 between 8-5.   Testing/Procedures: None  Follow-Up: Your physician recommends that you keep your virtual  follow-up appointment with Cecilie Kicks, NP on Wednesday, March 3 @ 10:15 am.   Any Other Special Instructions Will Be Listed Below (If Applicable).     If you need a refill on your cardiac medications before your next appointment, please call your pharmacy.

## 2019-10-29 ENCOUNTER — Other Ambulatory Visit: Payer: BC Managed Care – PPO | Admitting: *Deleted

## 2019-10-29 ENCOUNTER — Other Ambulatory Visit: Payer: Self-pay

## 2019-10-29 DIAGNOSIS — Z5189 Encounter for other specified aftercare: Secondary | ICD-10-CM

## 2019-10-29 DIAGNOSIS — I10 Essential (primary) hypertension: Secondary | ICD-10-CM | POA: Diagnosis not present

## 2019-10-29 DIAGNOSIS — R002 Palpitations: Secondary | ICD-10-CM

## 2019-10-30 LAB — BASIC METABOLIC PANEL
BUN/Creatinine Ratio: 15 (ref 12–28)
BUN: 14 mg/dL (ref 8–27)
CO2: 24 mmol/L (ref 20–29)
Calcium: 9.9 mg/dL (ref 8.7–10.3)
Chloride: 102 mmol/L (ref 96–106)
Creatinine, Ser: 0.93 mg/dL (ref 0.57–1.00)
GFR calc Af Amer: 75 mL/min/{1.73_m2} (ref >59)
GFR calc non Af Amer: 65 mL/min/{1.73_m2} (ref >59)
Glucose: 136 mg/dL — ABNORMAL HIGH (ref 65–99)
Potassium: 4.1 mmol/L (ref 3.5–5.2)
Sodium: 145 mmol/L — ABNORMAL HIGH (ref 134–144)

## 2019-11-11 NOTE — Progress Notes (Signed)
Virtual Visit via Video Note   This visit type was conducted due to national recommendations for restrictions regarding the COVID-19 Pandemic (e.g. social distancing) in an effort to limit this patient's exposure and mitigate transmission in our community.  Due to her co-morbid illnesses, this patient is at least at moderate risk for complications without adequate follow up.  This format is felt to be most appropriate for this patient at this time.  All issues noted in this document were discussed and addressed.  A limited physical exam was performed with this format.  Please refer to the patient's chart for her consent to telehealth for [facility].   Date:  11/12/2019   ID:  Wanda Barber, DOB 04-Sep-1956, MRN [id number]  Patient Location: Home Provider Location: Office  PCP:  Leighton Ruff, MD  Cardiologist:  Candee Furbish, MD  Electrophysiologist:  None   Evaluation Performed:  Follow-Up Visit  Chief Complaint:  HTN  History of Present Illness:    Wanda Barber is a 64 y.o. female with HTN  She has a hx of hyperlipidemia status post NMR lipid profile with LDL particle number 796-optimal, small LDL particle less than 90-optimal, LDL-C-84, HDL 72, triglycerides 51 - excellent profile here for evaluation of hyperlipidemia as well as family history of CAD.   She has no prior cardiovascular history. She was evaluated with sleep study. She was referred to ENT for snoring - no OSA.  Nuclear stress test December 2010 reassuring, low risk. Walks 3 miles 4 times a week, 30 min. Also does yoga.  At night she notes when laying on side, she feels palpitations, skips. Not during day. Sits at work and she does not notice. LDL less than 100.  Not taking aspirin. She does take fish oil. No bleeding.  08/12/2019 with Dr. Marlou Porch - here for follow up HL. Normal BP 114previously. Yesterday, blood work OK, Dr. Drema Dallas.She has been walking, exercising. Low salt. She does have 1+ of  wine at night. Her blood pressure today was elevated 160/90. On repeat same. I have asked her to purchase an Omron monitor for further updates  10/21/19 Here for bp check eval and on home cecks she is elevated 135/74 to 154/81 plan for ARB.  Cozaar last labs were stable in Nov 2020 - check BMP in 1 week.   No chest pan or SOB, she is concerned about her BP and continued elevation.  She remains active with walking and yoga. She has had more take out over COVID time, though usually does not cook with salt or add to food, but her BP is 162/97 today on third check.  She does feel she has increased stress.    Cozaar was started  Follow up labs were normal.   Today her BP is improved 126/68  initially would still be 160 on occasion but now mostly in [id number] systolic.  She does complain of headache that is new.  This may be from meds but have asked her to continue for another month and if increases or worsens to call.  I would try another ARB before changing   No chest pain or SOB.  Plans on having COVID vaccine    The patient does not have symptoms concerning for COVID-19 infection (fever, chills, cough, or new shortness of breath).    Past Medical History:  Diagnosis Date  . Allergy    SEASONAL  . Anxiety   . Hiatal hernia   . HTN (hypertension), benign 11/12/2019  . Hyperlipidemia   .  Schatzki's ring   . Thyroid nodule    Past Surgical History:  Procedure Laterality Date  . COLONOSCOPY    . DILATION AND CURETTAGE OF UTERUS  1991   due to miscariage     Current Meds  Medication Sig  . ALPRAZolam (NIRAVAM) 0.25 MG dissolvable tablet Take 0.25 mg by mouth at bedtime as needed for anxiety.  Marland Kitchen atorvastatin (LIPITOR) 20 MG tablet Take 20 mg by mouth daily.  . cholecalciferol (VITAMIN D) 1000 UNITS tablet Take 1,000 Units by mouth daily.  . Cyanocobalamin (VITAMIN B-12) 500 MCG LOZG Take 1 lozenge by mouth daily.   . fish oil-omega-3 fatty acids 1000 MG capsule Take 1 g by mouth daily.  Marland Kitchen  losartan (COZAAR) 50 MG tablet Take 1 tablet (50 mg total) by mouth daily.  . Multiple Vitamins-Minerals (MULTIVITAMIN WITH MINERALS) tablet Take 1 tablet by mouth daily.  . pantoprazole (PROTONIX) 40 MG tablet TAKE 1 TABLET BY MOUTH EVERY DAY     Allergies:   Paxil [paroxetine hcl]   Social History   Tobacco Use  . Smoking status: Never Smoker  . Smokeless tobacco: Never Used  . Tobacco comment: Occassionally  Substance Use Topics  . Alcohol use: Yes    Alcohol/week: 1.0 standard drinks    Types: 1 Glasses of wine per week    Comment: glass of wine with dinner  . Drug use: No     Family Hx: The patient's family history includes Asthma in her mother; Fibromyalgia in her sister; Heart disease in her father; Heart failure in her maternal grandmother and mother; Hypertension in her father; Pulmonary fibrosis in her father; Rheum arthritis in her mother. There is no history of Colon cancer.  ROS:   Please see the history of present illness.    General:no colds or fevers, no weight changes HEENT:no blurred vision, no congestion, + headache CV:see HPI PUL:see HPI Neuro:no syncope, no lightheadedness Endo:no diabetes, no thyroid disease  All other systems reviewed and are negative.   Prior CV studies:   The following studies were reviewed today:  Echo 05/08/16 Study Conclusions   - Left ventricle: The cavity size was normal. Wall thickness was  normal. Systolic function was normal. The estimated ejection  fraction was in the range of 60% to 65%. Wall motion was normal;  there were no regional wall motion abnormalities.  - Mitral valve: There was mild regurgitation.  - Tricuspid valve: There was trivial regurgitation.  - Pulmonary arteries: Systolic pressure was mildly increased. PA  peak pressure: 37 mm Hg (S).    Labs/Other Tests and Data Reviewed:    EKG:  No ECG reviewed.  Recent Labs: 10/29/2019: BUN 14; Creatinine, Ser 0.93; Potassium 4.1; Sodium 145    Recent Lipid Panel No results found for: CHOL, TRIG, HDL, CHOLHDL, LDLCALC, LDLDIRECT  Wt Readings from Last 3 Encounters:  11/12/19 126 lb (57.2 kg)  10/21/19 126 lb (57.2 kg)  08/12/19 126 lb (57.2 kg)     Objective:    Vital Signs:  BP 124/68   Pulse 72   Ht 5\' 4"  (1.626 m)   Wt 126 lb (57.2 kg)   BMI 21.63 kg/m    VITAL SIGNS:  reviewed  General alert female in no acute distress Neuro A&O X 3  Pulmonary, no SOB can speak in complete sentences without SOB  ASSESSMENT & PLAN:    1. HTN improved with Cozaar, does have headache not sure related though may be, if no improvement or if  increases she will call back and we will change meds.  otherwise follow up in 3 months.    COVID-19 Education: The signs and symptoms of COVID-19 were discussed with the patient and how to seek care for testing (follow up with PCP or arrange E-visit).  The importance of social distancing was discussed today.  Time:   Today, I have spent 5 minutes with the patient with telehealth technology discussing the above problems.     Medication Adjustments/Labs and Tests Ordered: Current medicines are reviewed at length with the patient today.  Concerns regarding medicines are outlined above.   Tests Ordered: No orders of the defined types were placed in this encounter.   Medication Changes: No orders of the defined types were placed in this encounter.   Follow Up:  Virtual Visit  in 3 month(s)  Signed, Cecilie Kicks, NP  11/12/2019 10:31 AM    [place]

## 2019-11-12 ENCOUNTER — Telehealth (INDEPENDENT_AMBULATORY_CARE_PROVIDER_SITE_OTHER): Payer: BC Managed Care – PPO | Admitting: Cardiology

## 2019-11-12 ENCOUNTER — Other Ambulatory Visit: Payer: Self-pay

## 2019-11-12 ENCOUNTER — Encounter: Payer: Self-pay | Admitting: Cardiology

## 2019-11-12 DIAGNOSIS — I10 Essential (primary) hypertension: Secondary | ICD-10-CM

## 2019-11-12 HISTORY — DX: Essential (primary) hypertension: I10

## 2019-11-12 NOTE — Patient Instructions (Signed)
Medication Instructions:  Your physician recommends that you continue on your current medications as directed. Please refer to the Current Medication list given to you today.  *If you need a refill on your cardiac medications before your next appointment, please call your pharmacy*   Lab Work: None ordered  If you have labs (blood work) drawn today and your tests are completely normal, you will receive your results only by: Marland Kitchen MyChart Message (if you have MyChart) OR . A paper copy in the mail If you have any lab test that is abnormal or we need to change your treatment, we will call you to review the results.   Testing/Procedures: None ordered   Follow-Up: At [facility], you and your health needs are our priority.  As part of our continuing mission to provide you with exceptional heart care, we have created designated Provider Care Teams.  These Care Teams include your primary Cardiologist (physician) and Advanced Practice Providers (APPs -  Physician Assistants and Nurse Practitioners) who all work together to provide you with the care you need, when you need it.  We recommend signing up for the patient portal called "MyChart".  Sign up information is provided on this After Visit Summary.  MyChart is used to connect with patients for Virtual Visits (Telemedicine).  Patients are able to view lab/test results, encounter notes, upcoming appointments, etc.  Non-urgent messages can be sent to your provider as well.   To learn more about what you can do with MyChart, go to [email or web address].    Your next appointment:   3 month(s)   9:45 A.M.  The format for your next appointment:   Virtual Visit   Provider:   Cecilie Kicks, NP

## 2020-02-02 DIAGNOSIS — F419 Anxiety disorder, unspecified: Secondary | ICD-10-CM | POA: Diagnosis not present

## 2020-02-02 DIAGNOSIS — E039 Hypothyroidism, unspecified: Secondary | ICD-10-CM | POA: Diagnosis not present

## 2020-02-02 DIAGNOSIS — K219 Gastro-esophageal reflux disease without esophagitis: Secondary | ICD-10-CM | POA: Diagnosis not present

## 2020-02-02 DIAGNOSIS — E7800 Pure hypercholesterolemia, unspecified: Secondary | ICD-10-CM | POA: Diagnosis not present

## 2020-02-02 DIAGNOSIS — Z1159 Encounter for screening for other viral diseases: Secondary | ICD-10-CM | POA: Diagnosis not present

## 2020-02-02 NOTE — Progress Notes (Signed)
Virtual Visit via Telephone Note   This visit type was conducted due to national recommendations for restrictions regarding the COVID-19 Pandemic (e.g. social distancing) in an effort to limit this patient's exposure and mitigate transmission in our community.  Due to her co-morbid illnesses, this patient is at least at moderate risk for complications without adequate follow up.  This format is felt to be most appropriate for this patient at this time.  The patient did not have access to video technology/had technical difficulties with video requiring transitioning to audio format only (telephone).  All issues noted in this document were discussed and addressed.  No physical exam could be performed with this format.  Please refer to the patient's chart for her  consent to telehealth for [facility].   The patient was identified using 2 identifiers.  Date:  02/03/2020   ID:  Wanda Barber, DOB 1956-04-30, MRN [id number]  Patient Location: Home Provider Location: Office  PCP:  Leighton Ruff, MD  Cardiologist:  Candee Furbish, MD  Electrophysiologist:  None   Evaluation Performed:  Follow-Up Visit  Chief Complaint:  HTN  History of Present Illness:    Wanda Barber is a 64 y.o. female with HTN.  She has a hx ofhyperlipidemia status post NMR lipid profile with LDL particle number 796-optimal, small LDL particle less than 90-optimal, LDL-C-84, HDL 72, triglycerides 51 - excellent profile here for evaluation of hyperlipidemia as well as family history of CAD.   She has no prior cardiovascular history. She was evaluated with sleep study. She was referred to ENT for snoring - no OSA.  Nuclear stress test December 2010 reassuring, low risk. Walks 3 miles 4 times a week, 30 min. Also does yoga.  At night she notes when laying on side, she feels palpitations, skips. Not during day. Sits at work and she does not notice. LDL less than 100.  Not taking aspirin. She does take fish  oil. No bleeding.  12/1/2020with Dr. Marlou Porch - here for follow up HL. Normal BP 114previously. Yesterday, blood work OK, Dr. Drema Dallas.She has been walking, exercising. Low salt. She does have 1+ of wine at night. Her blood pressure today was elevated 160/90. On repeat same. I have asked her to purchase an [employer] monitor for further updates  2/9/21Here for bp check eval and on home cecks she is elevated 135/74 to 154/81 plan for ARB. Cozaar last labs were stable in Nov 2020 - check BMP in 1 week.   No chest pan or SOB, she is concerned about her BP and continued elevation. She remains active with walking and yoga. She has had more take out over COVID time, though usually does not cook with salt or add to food, but her BP is 162/97 today on third check. She does feel she has increased stress.   Cozaar was started  Follow up labs were normal.   Last visit 11/12/19  her BP is improved 126/68  initially would still be 160 on occasion but now mostly in [id number] systolic.  She does complain of headache that is new.  This may be from meds but have asked her to continue for another month and if increases or worsens to call.  I would try another ARB before changing   No chest pain or SOB.  Plans on having COVID vaccine   Today feels well. No chest pain, no SOB, BP is well controlled.  No bleeding issues, her headaches are at the end of the day she believes related  to computer screen, she has seen optometrist and may need surgery in the future.  She also has cataracts.    The patient does not have symptoms concerning for COVID-19 infection (fever, chills, cough, or new shortness of breath).    Past Medical History:  Diagnosis Date  . Allergy    SEASONAL  . Anxiety   . Hiatal hernia   . HTN (hypertension), benign 11/12/2019  . Hyperlipidemia   . Schatzki's ring   . Thyroid nodule    Past Surgical History:  Procedure Laterality Date  . COLONOSCOPY    . DILATION AND CURETTAGE OF UTERUS   1991   due to miscariage     Current Meds  Medication Sig  . atorvastatin (LIPITOR) 20 MG tablet Take 20 mg by mouth daily.  . cholecalciferol (VITAMIN D) 1000 UNITS tablet Take 1,000 Units by mouth daily.  . Cyanocobalamin (VITAMIN B-12) 500 MCG LOZG Take 1 lozenge by mouth daily.   . fish oil-omega-3 fatty acids 1000 MG capsule Take 1 g by mouth daily.  Marland Kitchen LORazepam (ATIVAN) 0.5 MG tablet Take 0.5 mg by mouth daily as needed.  Marland Kitchen losartan (COZAAR) 50 MG tablet Take 1 tablet (50 mg total) by mouth daily.  . Multiple Vitamins-Minerals (MULTIVITAMIN WITH MINERALS) tablet Take 1 tablet by mouth daily.  . pantoprazole (PROTONIX) 40 MG tablet Take 40 mg by mouth as needed (for heartburn).  . [DISCONTINUED] ALPRAZolam (NIRAVAM) 0.25 MG dissolvable tablet Take 0.25 mg by mouth at bedtime as needed for anxiety.     Allergies:   Paxil [paroxetine hcl]   Social History   Tobacco Use  . Smoking status: Never Smoker  . Smokeless tobacco: Never Used  . Tobacco comment: Occassionally  Substance Use Topics  . Alcohol use: Yes    Alcohol/week: 1.0 standard drinks    Types: 1 Glasses of wine per week    Comment: glass of wine with dinner  . Drug use: No     Family Hx: The patient's family history includes Asthma in her mother; Fibromyalgia in her sister; Heart disease in her father; Heart failure in her maternal grandmother and mother; Hypertension in her father; Pulmonary fibrosis in her father; Rheum arthritis in her mother. There is no history of Colon cancer.  ROS:   Please see the history of present illness.    General:no colds or fevers, no weight changes HEENT:no blurred vision, no congestion CV:see HPI PUL:see HPI GI:no diarrhea constipation or melena, no indigestion Neuro:no syncope, no lightheadedness  All other systems reviewed and are negative.   Prior CV studies:   The following studies were reviewed today:  Echo 05/08/16 Study Conclusions   - Left ventricle: The  cavity size was normal. Wall thickness was  normal. Systolic function was normal. The estimated ejection  fraction was in the range of 60% to 65%. Wall motion was normal;  there were no regional wall motion abnormalities.  - Mitral valve: There was mild regurgitation.  - Tricuspid valve: There was trivial regurgitation.  - Pulmonary arteries: Systolic pressure was mildly increased. PA  peak pressure: 37 mm Hg (S).   Labs/Other Tests and Data Reviewed:    EKG:  No ECG reviewed.  Recent Labs: 10/29/2019: BUN 14; Creatinine, Ser 0.93; Potassium 4.1; Sodium 145   Recent Lipid Panel No results found for: CHOL, TRIG, HDL, CHOLHDL, LDLCALC, LDLDIRECT  Wt Readings from Last 3 Encounters:  02/03/20 128 lb (58.1 kg)  11/12/19 126 lb (57.2 kg)  10/21/19 126 lb (  57.2 kg)     Objective:    Vital Signs:  BP 124/73   Pulse 78   Ht 5\' 4"  (1.626 m)   Wt 128 lb (58.1 kg)   BMI 21.97 kg/m    VITAL SIGNS:  reviewed  General no acute distress Pulmonary can speak in complete sentences without SOB Neuro A&O X 3    ASSESSMENT & PLAN:    1. HTN improved with cozaar and stable. Continue current dose - headaches from vision issues  Follow up with Dr. Marlou Porch n 6 months.  COVID-19 Education: The signs and symptoms of COVID-19 were discussed with the patient and how to seek care for testing (follow up with PCP or arrange E-visit).  The importance of social distancing was discussed today.  Time:   Today, I have spent 5 minutes with the patient with telehealth technology discussing the above problems.     Medication Adjustments/Labs and Tests Ordered: Current medicines are reviewed at length with the patient today.  Concerns regarding medicines are outlined above.   Tests Ordered: No orders of the defined types were placed in this encounter.   Medication Changes: No orders of the defined types were placed in this encounter.   Follow Up:  In Person in 6 month(s)  Signed, Cecilie Kicks, NP  02/03/2020 10:33 AM    [place]

## 2020-02-03 ENCOUNTER — Encounter: Payer: Self-pay | Admitting: Cardiology

## 2020-02-03 ENCOUNTER — Other Ambulatory Visit: Payer: Self-pay

## 2020-02-03 ENCOUNTER — Telehealth (INDEPENDENT_AMBULATORY_CARE_PROVIDER_SITE_OTHER): Payer: BC Managed Care – PPO | Admitting: Cardiology

## 2020-02-03 VITALS — BP 124/73 | HR 78 | Ht 64.0 in | Wt 128.0 lb

## 2020-02-03 DIAGNOSIS — I10 Essential (primary) hypertension: Secondary | ICD-10-CM | POA: Diagnosis not present

## 2020-02-03 NOTE — Patient Instructions (Signed)
Medication Instructions:  Your physician recommends that you continue on your current medications as directed. Please refer to the Current Medication list given to you today.  *If you need a refill on your cardiac medications before your next appointment, please call your pharmacy*   Lab Work: None If you have labs (blood work) drawn today and your tests are completely normal, you will receive your results only by: Marland Kitchen MyChart Message (if you have MyChart) OR . A paper copy in the mail If you have any lab test that is abnormal or we need to change your treatment, we will call you to review the results.   Testing/Procedures: None   Follow-Up: At [facility], you and your health needs are our priority.  As part of our continuing mission to provide you with exceptional heart care, we have created designated Provider Care Teams.  These Care Teams include your primary Cardiologist (physician) and Advanced Practice Providers (APPs -  Physician Assistants and Nurse Practitioners) who all work together to provide you with the care you need, when you need it.  We recommend signing up for the patient portal called "MyChart".  Sign up information is provided on this After Visit Summary.  MyChart is used to connect with patients for Virtual Visits (Telemedicine).  Patients are able to view lab/test results, encounter notes, upcoming appointments, etc.  Non-urgent messages can be sent to your provider as well.   To learn more about what you can do with MyChart, go to [email or web address].    Your next appointment:   6 month(s)  The format for your next appointment:   In Person  Provider:   You may see Candee Furbish, MD or one of the following Advanced Practice Providers on your designated Care Team:    Truitt Merle, NP  Cecilie Kicks, NP  Kathyrn Drown, NP    Other Instructions

## 2020-02-17 ENCOUNTER — Telehealth: Payer: BC Managed Care – PPO | Admitting: Cardiology

## 2020-07-09 DIAGNOSIS — Z1231 Encounter for screening mammogram for malignant neoplasm of breast: Secondary | ICD-10-CM | POA: Diagnosis not present

## 2020-07-09 DIAGNOSIS — Z01419 Encounter for gynecological examination (general) (routine) without abnormal findings: Secondary | ICD-10-CM | POA: Diagnosis not present

## 2020-08-02 DIAGNOSIS — E7800 Pure hypercholesterolemia, unspecified: Secondary | ICD-10-CM | POA: Diagnosis not present

## 2020-08-02 DIAGNOSIS — I10 Essential (primary) hypertension: Secondary | ICD-10-CM | POA: Diagnosis not present

## 2020-08-02 DIAGNOSIS — G479 Sleep disorder, unspecified: Secondary | ICD-10-CM | POA: Diagnosis not present

## 2020-08-02 DIAGNOSIS — E041 Nontoxic single thyroid nodule: Secondary | ICD-10-CM | POA: Diagnosis not present

## 2020-08-02 DIAGNOSIS — Z0000 Encounter for general adult medical examination without abnormal findings: Secondary | ICD-10-CM | POA: Diagnosis not present

## 2020-08-02 DIAGNOSIS — Z23 Encounter for immunization: Secondary | ICD-10-CM | POA: Diagnosis not present

## 2020-08-02 DIAGNOSIS — K219 Gastro-esophageal reflux disease without esophagitis: Secondary | ICD-10-CM | POA: Diagnosis not present

## 2020-08-12 ENCOUNTER — Other Ambulatory Visit: Payer: Self-pay

## 2020-08-12 ENCOUNTER — Encounter: Payer: Self-pay | Admitting: Cardiology

## 2020-08-12 ENCOUNTER — Ambulatory Visit: Payer: BC Managed Care – PPO | Admitting: Cardiology

## 2020-08-12 VITALS — BP 132/80 | HR 61 | Ht 64.0 in | Wt 129.0 lb

## 2020-08-12 DIAGNOSIS — R002 Palpitations: Secondary | ICD-10-CM

## 2020-08-12 DIAGNOSIS — I10 Essential (primary) hypertension: Secondary | ICD-10-CM

## 2020-08-12 NOTE — Progress Notes (Signed)
Cardiology Office Note:    Date:  08/12/2020   ID:  Wanda Barber, DOB [birth year], MRN [id number]  PCP:  Leighton Ruff, MD  [facility] Cardiologist:  Candee Furbish, MD  [facility] Electrophysiologist:  None   Referring MD: Leighton Ruff, MD     History of Present Illness:    Wanda Barber is a 64 y.o. female here for the follow-up of palpitations hypertension.  Last seen by Cecilie Kicks.  Notes reviewed.  She has been working on her hypertension.  Blood pressure is under much better control on losartan 50 mg.  Has family history of CAD.  Not really feeling any significant palpitations anymore.  Excellent.  She has been working from home since [employer].  She continues to work.  Hopefully looking at 66-1/2.  Her daughter is just completing additional schooling.  Excellent.    Denies any fevers chills nausea vomiting syncope bleeding  Past Medical History:  Diagnosis Date   Allergy    SEASONAL   Anxiety    Hiatal hernia    HTN (hypertension), benign 11/12/2019   Hyperlipidemia    Schatzki's ring    Thyroid nodule     Past Surgical History:  Procedure Laterality Date   COLONOSCOPY     DILATION AND CURETTAGE OF UTERUS  1991   due to miscariage    Current Medications: Current Meds  Medication Sig   atorvastatin (LIPITOR) 20 MG tablet Take 20 mg by mouth daily.   cholecalciferol (VITAMIN D) 1000 UNITS tablet Take 1,000 Units by mouth daily.   Cyanocobalamin (VITAMIN B-12) 500 MCG LOZG Take 1 lozenge by mouth daily.    fish oil-omega-3 fatty acids 1000 MG capsule Take 1 g by mouth daily.   levothyroxine (SYNTHROID) 25 MCG tablet Take 25 mcg by mouth daily.   LORazepam (ATIVAN) 0.5 MG tablet Take 0.5 mg by mouth daily as needed.   losartan (COZAAR) 50 MG tablet Take 1 tablet (50 mg total) by mouth daily.   Multiple Vitamins-Minerals (MULTIVITAMIN WITH MINERALS) tablet Take 1 tablet by mouth daily.   pantoprazole (PROTONIX) 40 MG tablet Take  40 mg by mouth as needed (for heartburn).     Allergies:   Paxil [paroxetine hcl]   Social History   Socioeconomic History   Marital status: Married    Spouse name: Not on file   Number of children: 2   Years of education: Not on file   Highest education level: Not on file  Occupational History   Occupation: Sales  Tobacco Use   Smoking status: Never Smoker   Smokeless tobacco: Never Used   Tobacco comment: Occassionally  Vaping Use   Vaping Use: Never used  Substance and Sexual Activity   Alcohol use: Yes    Alcohol/week: 1.0 standard drink    Types: 1 Glasses of wine per week    Comment: glass of wine with dinner   Drug use: No   Sexual activity: Not on file  Other Topics Concern   Not on file  Social History Narrative   Caffeine: yes 2+ servings daily, tea   Exercise: yes, walks 45 minutes per day   Social Determinants of Health   Financial Resource Strain:    Difficulty of Paying Living Expenses: Not on file  Food Insecurity:    Worried About Charity fundraiser in the Last Year: Not on file   [employer] of Food in the Last Year: Not on file  Transportation Needs:    Lack of Transportation (  Medical): Not on file   Lack of Transportation (Non-Medical): Not on file  Physical Activity:    Days of Exercise per Week: Not on file   Minutes of Exercise per Session: Not on file  Stress:    Feeling of Stress : Not on file  Social Connections:    Frequency of Communication with Friends and Family: Not on file   Frequency of Social Gatherings with Friends and Family: Not on file   Attends Religious Services: Not on file   Active Member of Clubs or Organizations: Not on file   Attends Archivist Meetings: Not on file   Marital Status: Not on file     Family History: The patient's family history includes Asthma in her mother; Fibromyalgia in her sister; Heart disease in her father; Heart failure in her maternal grandmother and  mother; Hypertension in her father; Pulmonary fibrosis in her father; Rheum arthritis in her mother. There is no history of Colon cancer.  ROS:   Please see the history of present illness.     All other systems reviewed and are negative.  EKGs/Labs/Other Studies Reviewed:    The following studies were reviewed today:  Echocardiogram 2017:  - Left ventricle: The cavity size was normal. Wall thickness was  normal. Systolic function was normal. The estimated ejection  fraction was in the range of 60% to 65%. Wall motion was normal;  there were no regional wall motion abnormalities.  - Mitral valve: There was mild regurgitation.  - Tricuspid valve: There was trivial regurgitation.  - Pulmonary arteries: Systolic pressure was mildly increased. PA  peak pressure: 37 mm Hg (S).   EKG:  EKG is  ordered today.  The ekg ordered today demonstrates sinus rhythm 61 with no other abnormalities.  Recent Labs: 10/29/2019: BUN 14; Creatinine, Ser 0.93; Potassium 4.1; Sodium 145  Recent Lipid Panel No results found for: CHOL, TRIG, HDL, CHOLHDL, VLDL, LDLCALC, LDLDIRECT   Physical Exam:    VS:  BP 132/80 (BP Location: Left Arm, Patient Position: Sitting, Cuff Size: Normal)    Pulse 61    Ht 5\' 4"  (1.626 m)    Wt 129 lb (58.5 kg)    SpO2 98%    BMI 22.14 kg/m     Wt Readings from Last 3 Encounters:  08/12/20 129 lb (58.5 kg)  02/03/20 128 lb (58.1 kg)  11/12/19 126 lb (57.2 kg)     GEN:  Well nourished, well developed in no acute distress HEENT: Normal NECK: No JVD; No carotid bruits LYMPHATICS: No lymphadenopathy CARDIAC: RRR, no murmurs, rubs, gallops RESPIRATORY:  Clear to auscultation without rales, wheezing or rhonchi  ABDOMEN: Soft, non-tender, non-distended MUSCULOSKELETAL:  No edema; No deformity  SKIN: Warm and dry NEUROLOGIC:  Alert and oriented x 3 PSYCHIATRIC:  Normal affect   ASSESSMENT:    1. HTN (hypertension), benign   2. Palpitations    PLAN:    In  order of problems listed above:  Palpitations -Noted previously when laying on left or right side in the middle of the night.  Pause-like beat.  Suggestive of PVCs PACs.  Reassuring EKG today.  Prior lab work and TSH reassuring. Rare to feel now.  Excellent.  HTN -Home blood pressure cuff.  Periodically checking.  Mitral regurgitation -Mild.  No murmur.  Hyperlipidemia -Atorvastatin 20 with LDL 77.  Family history of CAD -Father  Snoring -Sleep study was negative for sleep apnea.    Shared Decision Making/Informed Consent  Medication Adjustments/Labs and Tests Ordered: Current medicines are reviewed at length with the patient today.  Concerns regarding medicines are outlined above.  Orders Placed This Encounter  Procedures   EKG 12-Lead   No orders of the defined types were placed in this encounter.   Patient Instructions  Medication Instructions:  The current medical regimen is effective;  continue present plan and medications.  *If you need a refill on your cardiac medications before your next appointment, please call your pharmacy*  Follow-Up: At [facility], you and your health needs are our priority.  As part of our continuing mission to provide you with exceptional heart care, we have created designated Provider Care Teams.  These Care Teams include your primary Cardiologist (physician) and Advanced Practice Providers (APPs -  Physician Assistants and Nurse Practitioners) who all work together to provide you with the care you need, when you need it.  We recommend signing up for the patient portal called "MyChart".  Sign up information is provided on this After Visit Summary.  MyChart is used to connect with patients for Virtual Visits (Telemedicine).  Patients are able to view lab/test results, encounter notes, upcoming appointments, etc.  Non-urgent messages can be sent to your provider as well.   To learn more about what you can do with MyChart, go to  [email or web address].    Your next appointment:   12 month(s)  The format for your next appointment:   In Person  Provider:   Candee Furbish, MD   Thank you for choosing [facility]!!        Signed, Candee Furbish, MD  08/12/2020 3:18 PM    [place]

## 2020-08-12 NOTE — Patient Instructions (Signed)
Medication Instructions:  The current medical regimen is effective;  continue present plan and medications.  *If you need a refill on your cardiac medications before your next appointment, please call your pharmacy*  Follow-Up: At [facility], you and your health needs are our priority.  As part of our continuing mission to provide you with exceptional heart care, we have created designated Provider Care Teams.  These Care Teams include your primary Cardiologist (physician) and Advanced Practice Providers (APPs -  Physician Assistants and Nurse Practitioners) who all work together to provide you with the care you need, when you need it.  We recommend signing up for the patient portal called "MyChart".  Sign up information is provided on this After Visit Summary.  MyChart is used to connect with patients for Virtual Visits (Telemedicine).  Patients are able to view lab/test results, encounter notes, upcoming appointments, etc.  Non-urgent messages can be sent to your provider as well.   To learn more about what you can do with MyChart, go to [email or web address].    Your next appointment:   12 month(s)  The format for your next appointment:   In Person  Provider:   Mark Skains, MD   Thank you for choosing [facility]!!      

## 2020-08-16 DIAGNOSIS — Z20822 Contact with and (suspected) exposure to covid-19: Secondary | ICD-10-CM | POA: Diagnosis not present

## 2020-08-16 DIAGNOSIS — R197 Diarrhea, unspecified: Secondary | ICD-10-CM | POA: Diagnosis not present

## 2020-08-16 DIAGNOSIS — J398 Other specified diseases of upper respiratory tract: Secondary | ICD-10-CM | POA: Diagnosis not present

## 2020-10-08 ENCOUNTER — Other Ambulatory Visit: Payer: Self-pay | Admitting: Cardiology

## 2020-11-26 IMAGING — US ULTRASOUND RIGHT BREAST LIMITED
1 series · 13 of 16 positions shown · non-contrast
Comparison: Previous exam(s).

CLINICAL DATA: Screening recall for possible asymmetries in the
right breast.

EXAM:
DIGITAL DIAGNOSTIC RIGHT MAMMOGRAM WITH CAD AND TOMO
ULTRASOUND RIGHT BREAST

[Series 1: ultrasound right breast limited · 0.06mm/px · 13 of 16 slices shown]
[im 1/16]
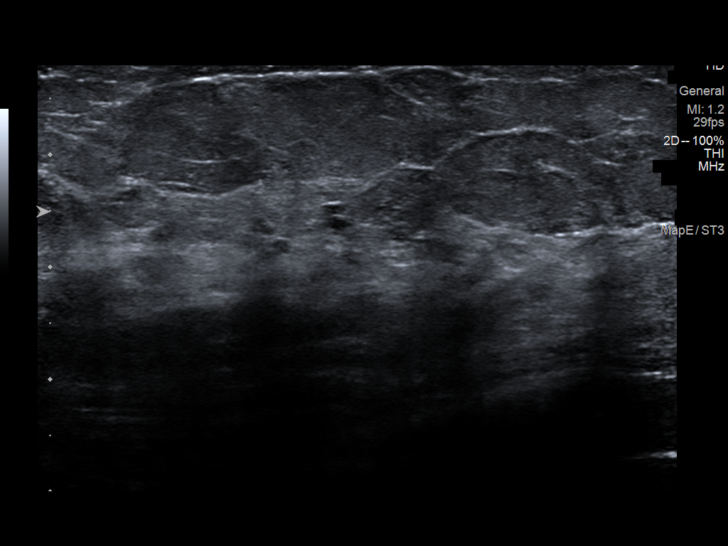
[im 2/16]
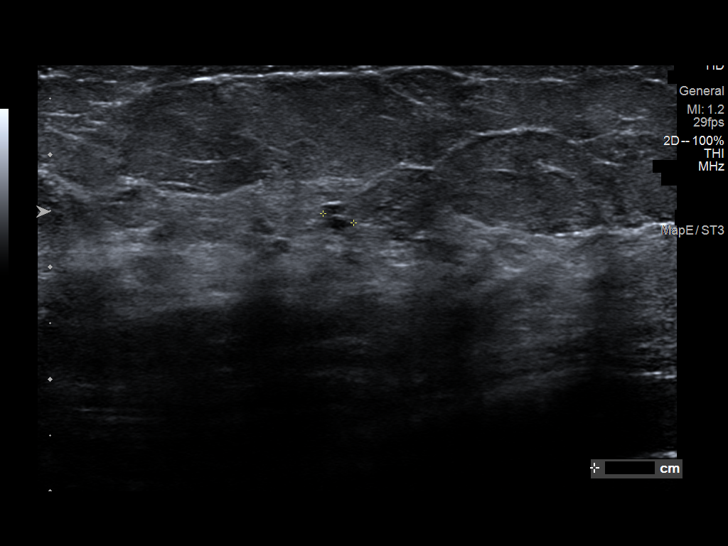
[im 4/16]
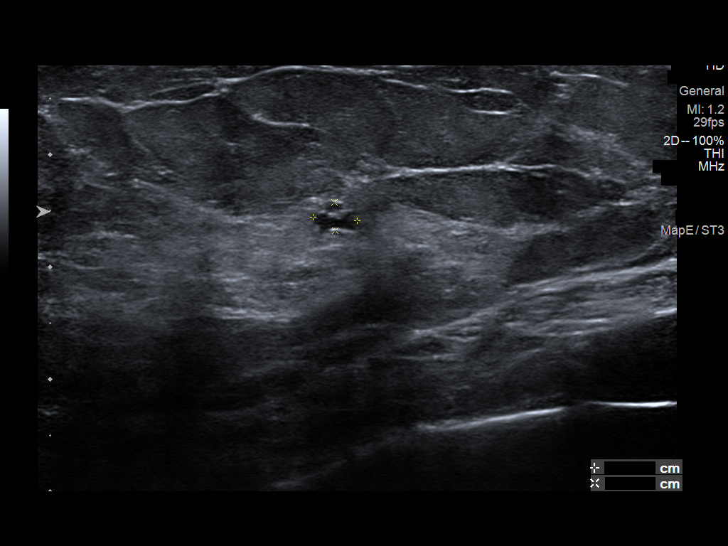
[im 5/16]
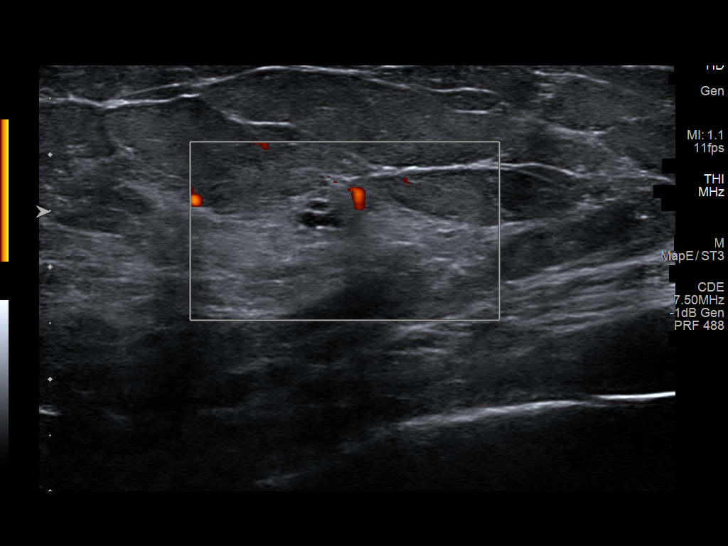
[im 6/16]
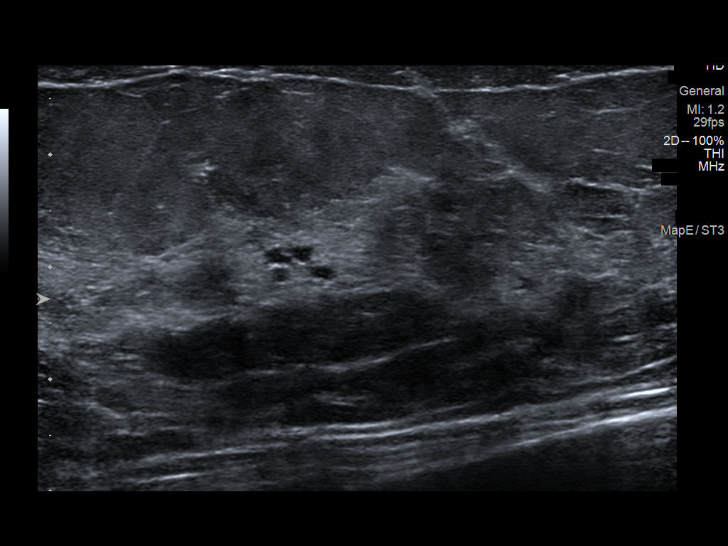
[im 7/16]
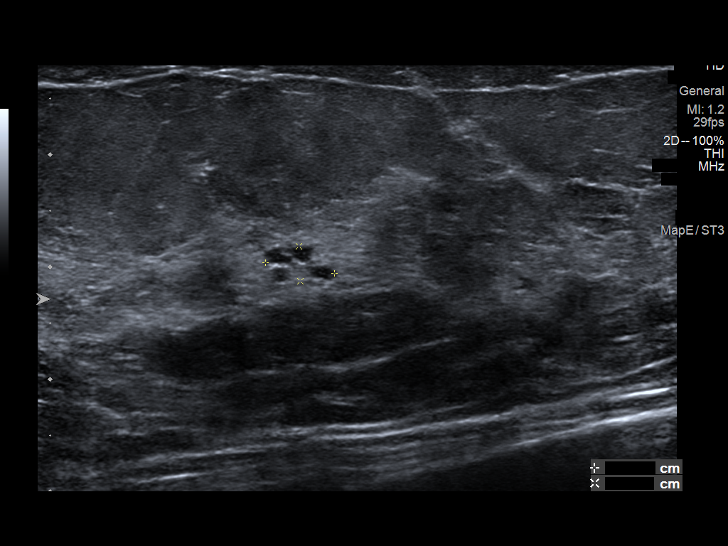
[im 9/16]
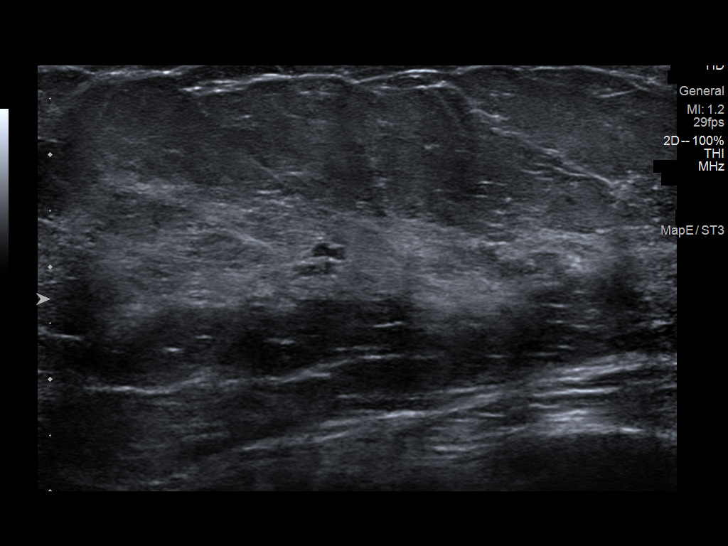
[im 10/16]
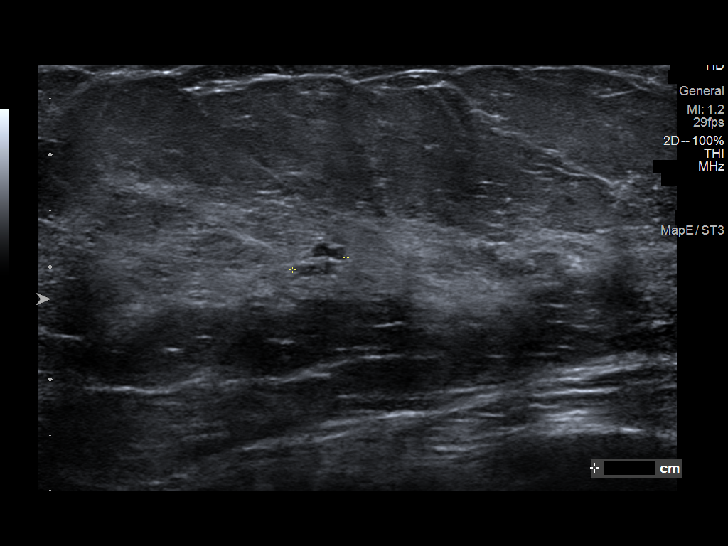
[im 11/16]
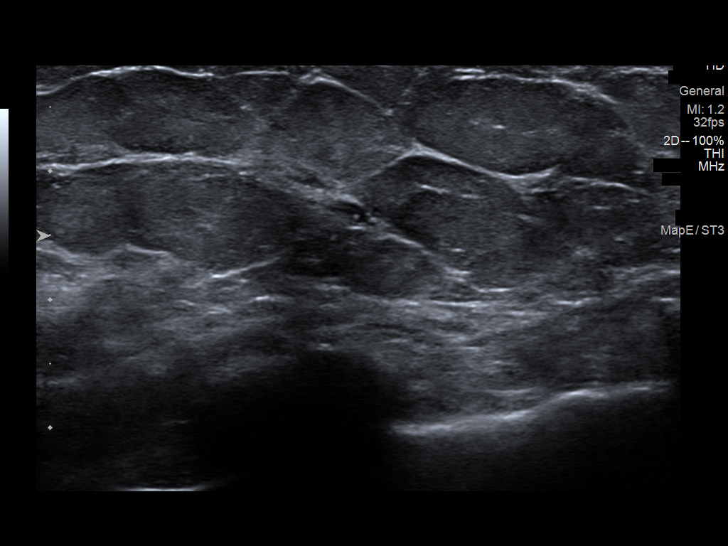
[im 12/16]
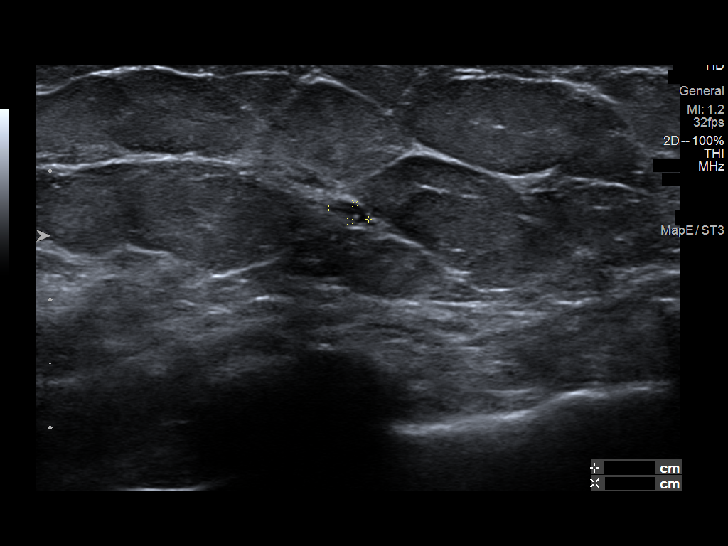
[im 13/16]
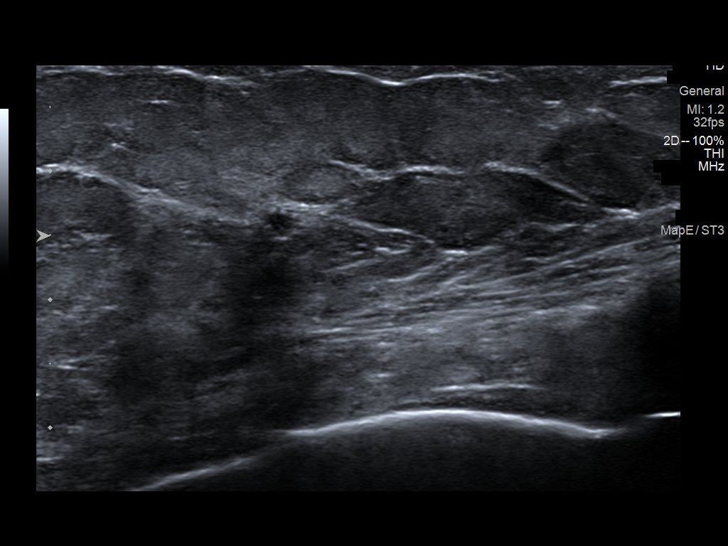
[im 15/16]
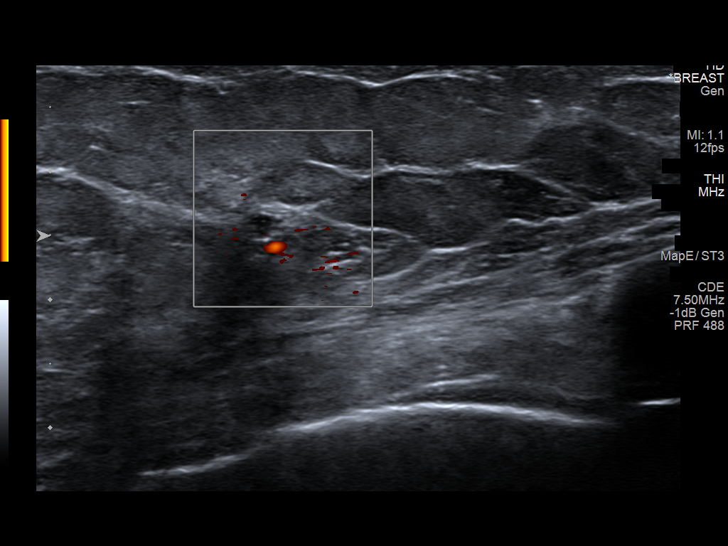
[im 16/16]
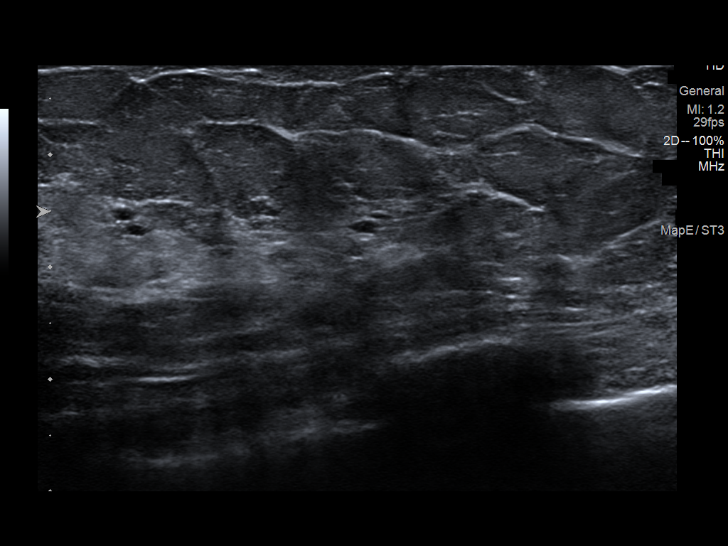

[13 of 16 positions shown; findings below may reference images not displayed]

ACR Breast Density Category b: There are scattered areas of
fibroglandular density.
FINDINGS: On the diagnostic images, the possible asymmetries, best seen on the
CC views, appear as groups of small round oval circumscribed masses,
extending from the medial retroareolar region to the medial
posterior depth aspect of the right breast.

There are no areas of architectural distortion. There are no
suspicious calcifications.

Mammographic images were processed with CAD.

Targeted ultrasound is performed, showing several clusters of cysts
in the right breast. Largest cluster lies at 2 o'clock, 1 cm from
the nipple, measuring 6 x 3 x 5 mm. Another smaller cluster lies at
2 o'clock, 4 cm the nipple, measuring 4 mm in greatest dimension.
Other small cysts are also noted in the medial right breast. There
are no solid masses or suspicious lesions.
IMPRESSION: 1. No evidence of breast malignancy.
2. Benign cysts and clusters of small cysts in the right breast.

RECOMMENDATION:
Screening mammogram in one year.(Code:KA-1-MSQ)

I have discussed the findings and recommendations with the patient.
Results were also provided in writing at the conclusion of the
visit. If applicable, a reminder letter will be sent to the patient
regarding the next appointment.

BI-RADS CATEGORY  2: Benign.

## 2021-01-28 DIAGNOSIS — H35372 Puckering of macula, left eye: Secondary | ICD-10-CM | POA: Diagnosis not present

## 2021-02-03 DIAGNOSIS — F419 Anxiety disorder, unspecified: Secondary | ICD-10-CM | POA: Diagnosis not present

## 2021-02-03 DIAGNOSIS — E7800 Pure hypercholesterolemia, unspecified: Secondary | ICD-10-CM | POA: Diagnosis not present

## 2021-02-03 DIAGNOSIS — E039 Hypothyroidism, unspecified: Secondary | ICD-10-CM | POA: Diagnosis not present

## 2021-02-03 DIAGNOSIS — I10 Essential (primary) hypertension: Secondary | ICD-10-CM | POA: Diagnosis not present

## 2021-02-08 ENCOUNTER — Encounter: Payer: Self-pay | Admitting: Internal Medicine

## 2021-03-15 DIAGNOSIS — H2513 Age-related nuclear cataract, bilateral: Secondary | ICD-10-CM | POA: Diagnosis not present

## 2021-03-15 DIAGNOSIS — H18413 Arcus senilis, bilateral: Secondary | ICD-10-CM | POA: Diagnosis not present

## 2021-03-15 DIAGNOSIS — H25013 Cortical age-related cataract, bilateral: Secondary | ICD-10-CM | POA: Diagnosis not present

## 2021-03-15 DIAGNOSIS — H2512 Age-related nuclear cataract, left eye: Secondary | ICD-10-CM | POA: Diagnosis not present

## 2021-03-15 DIAGNOSIS — H35373 Puckering of macula, bilateral: Secondary | ICD-10-CM | POA: Diagnosis not present

## 2021-04-07 ENCOUNTER — Other Ambulatory Visit: Payer: Self-pay

## 2021-04-07 ENCOUNTER — Ambulatory Visit (AMBULATORY_SURGERY_CENTER): Payer: Self-pay | Admitting: *Deleted

## 2021-04-07 VITALS — Ht 64.0 in | Wt 129.0 lb

## 2021-04-07 DIAGNOSIS — Z86010 Personal history of colonic polyps: Secondary | ICD-10-CM

## 2021-04-07 MED ORDER — NA SULFATE-K SULFATE-MG SULF 17.5-3.13-1.6 GM/177ML PO SOLN: 1.0000 | ORAL | 0 refills | Status: DC

## 2021-04-07 NOTE — Progress Notes (Signed)
Patient is here in-person for PV. Patient denies any allergies to eggs or soy. Patient denies any problems with anesthesia/sedation. Patient denies any oxygen use at home. Patient denies taking any diet/weight loss medications or blood thinners. Patient is aware of our care-partner policy and 0000000 safety protocol.   EMMI education assigned to the patient for the procedure, sent to [place].   Patient is COVID-19 vaccinated, per patient.

## 2021-04-13 ENCOUNTER — Telehealth: Payer: Self-pay | Admitting: Internal Medicine

## 2021-04-13 DIAGNOSIS — Z86010 Personal history of colonic polyps: Secondary | ICD-10-CM

## 2021-04-13 MED ORDER — SUTAB 1479-225-188 MG PO TABS: 24.0000 | ORAL_TABLET | ORAL | 0 refills | Status: DC

## 2021-04-13 MED ORDER — PEG 3350-KCL-NA BICARB-NACL 420 G PO SOLR: 4000.0000 mL | Freq: Once | ORAL | 0 refills | Status: AC

## 2021-04-13 NOTE — Telephone Encounter (Signed)
Inbound call from patient pharmacy [facility]. States suprep requires PA. Asked if patient could be prescribed Randal Buba G since it is covered with OOP cost of $0.80

## 2021-04-13 NOTE — Addendum Note (Signed)
Addended by: Steva Ready on: 04/13/2021 03:34 PM   Modules accepted: Orders

## 2021-04-13 NOTE — Telephone Encounter (Signed)
Called [place]

## 2021-04-13 NOTE — Telephone Encounter (Signed)
Per [place] is requiring a PA  Informed no PA for preps  $107 Suprep  $7.20 For Golytely - Golytely prep instructions sent to pt via My Chart today    Called pt- she dos not want the Golytely- she decided to do the [place] - she aware prep will cost 40-50$  pt asked I email her the [place] instructions to  [email or web address]- email sent   I called [facility] and informed them she does not want the Golytely prep, she chose [place] and that I sent in the coupon information with her script

## 2021-04-19 DIAGNOSIS — E039 Hypothyroidism, unspecified: Secondary | ICD-10-CM | POA: Diagnosis not present

## 2021-04-21 ENCOUNTER — Encounter: Payer: Self-pay | Admitting: Internal Medicine

## 2021-04-21 ENCOUNTER — Other Ambulatory Visit: Payer: Self-pay

## 2021-04-21 ENCOUNTER — Ambulatory Visit (AMBULATORY_SURGERY_CENTER): Payer: BC Managed Care – PPO | Admitting: Internal Medicine

## 2021-04-21 VITALS — BP 130/75 | HR 73 | Temp 98.3°F | Resp 10 | Ht 64.0 in | Wt 129.0 lb

## 2021-04-21 DIAGNOSIS — D125 Benign neoplasm of sigmoid colon: Secondary | ICD-10-CM | POA: Diagnosis not present

## 2021-04-21 DIAGNOSIS — Z1211 Encounter for screening for malignant neoplasm of colon: Secondary | ICD-10-CM | POA: Diagnosis not present

## 2021-04-21 DIAGNOSIS — Z86010 Personal history of colonic polyps: Secondary | ICD-10-CM | POA: Diagnosis not present

## 2021-04-21 MED ORDER — SODIUM CHLORIDE 0.9 % IV SOLN: 500.0000 mL | Freq: Once | INTRAVENOUS | Status: AC

## 2021-04-21 NOTE — Op Note (Signed)
[place] Patient Name: Wanda Barber Procedure Date: 04/21/2021 9:31 AM MRN: [id number] Endoscopist: Jerene Bears , MD Age: 65 Referring MD:  Date of Birth: [birth year] Gender: Female Account #: [id number] Procedure:                Colonoscopy Indications:              High risk colon cancer surveillance: Personal                            history of non-advanced adenoma, Last colonoscopy:                            March 2017 Medicines:                Monitored Anesthesia Care Procedure:                Pre-Anesthesia Assessment:                           - Prior to the procedure, a History and Physical                            was performed, and patient medications and                            allergies were reviewed. The patient's tolerance of                            previous anesthesia was also reviewed. The risks                            and benefits of the procedure and the sedation                            options and risks were discussed with the patient.                            All questions were answered, and informed consent                            was obtained. Prior Anticoagulants: The patient has                            taken no previous anticoagulant or antiplatelet                            agents. ASA Grade Assessment: II - A patient with                            mild systemic disease. After reviewing the risks                            and benefits, the patient was deemed in  satisfactory condition to undergo the procedure.                           After obtaining informed consent, the colonoscope                            was passed under direct vision. Throughout the                            procedure, the patient's blood pressure, pulse, and                            oxygen saturations were monitored continuously. The                            Olympus [id number] Colonoscope was                             introduced through the anus and advanced to the                            cecum, identified by transillumination. The                            colonoscopy was performed without difficulty. The                            patient tolerated the procedure well. The quality                            of the bowel preparation was good. The ileocecal                            valve, appendiceal orifice, and rectum were                            photographed. Scope In: 9:44:49 AM Scope Out: 9:58:56 AM Scope Withdrawal Time: 0 hours 11 minutes 43 seconds  Total Procedure Duration: 0 hours 14 minutes 7 seconds  Findings:                 The digital rectal exam was normal.                           A 4 mm polyp was found in the sigmoid colon. The                            polyp was sessile. The polyp was removed with a                            cold snare. Resection and retrieval were complete.                           Multiple small and large-mouthed diverticula were  found in the sigmoid colon, descending colon and                            hepatic flexure.                           The retroflexed view of the distal rectum and anal                            verge was normal and showed no anal or rectal                            abnormalities. Complications:            No immediate complications. Estimated Blood Loss:     Estimated blood loss was minimal. Impression:               - One 4 mm polyp in the sigmoid colon, removed with                            a cold snare. Resected and retrieved.                           - Diverticulosis in the sigmoid colon, in the                            descending colon and at the hepatic flexure.                           - The distal rectum and anal verge are normal on                            retroflexion view. Recommendation:           - Patient has a contact number available for                             emergencies. The signs and symptoms of potential                            delayed complications were discussed with the                            patient. Return to normal activities tomorrow.                            Written discharge instructions were provided to the                            patient.                           - Resume previous diet.                           - Continue present medications.                           -  Await pathology results.                           - Repeat colonoscopy is recommended for                            surveillance. The colonoscopy date will be                            determined after pathology results from today's                            exam become available for review. Jerene Bears, MD 04/21/2021 10:01:13 AM This report has been signed electronically.

## 2021-04-21 NOTE — Progress Notes (Signed)
Medical history reviewed with no changes noted. VS assessed by C.W 

## 2021-04-21 NOTE — Progress Notes (Signed)
PT taken to PACU. Monitors in place. VSS. Report given to RN. 

## 2021-04-21 NOTE — Patient Instructions (Addendum)
YOU HAD AN ENDOSCOPIC PROCEDURE TODAY AT THE [facility]:   Refer to the procedure report that was given to you for any specific questions about what was found during the examination.  If the procedure report does not answer your questions, please call your gastroenterologist to clarify.  If you requested that your care partner not be given the details of your procedure findings, then the procedure report has been included in a sealed envelope for you to review at your convenience later.  YOU SHOULD EXPECT: Some feelings of bloating in the abdomen. Passage of more gas than usual.  Walking can help get rid of the air that was put into your GI tract during the procedure and reduce the bloating. If you had a lower endoscopy (such as a colonoscopy or flexible sigmoidoscopy) you may notice spotting of blood in your stool or on the toilet paper. If you underwent a bowel prep for your procedure, you may not have a normal bowel movement for a few days.  Please Note:  You might notice some irritation and congestion in your nose or some drainage.  This is from the oxygen used during your procedure.  There is no need for concern and it should clear up in a day or so.  SYMPTOMS TO REPORT IMMEDIATELY:  Following lower endoscopy (colonoscopy or flexible sigmoidoscopy):  Excessive amounts of blood in the stool  Significant tenderness or worsening of abdominal pains  Swelling of the abdomen that is new, acute  Fever of 100F or higher   For urgent or emergent issues, a gastroenterologist can be reached at any hour by calling [phone]. Do not use MyChart messaging for urgent concerns.    DIET:  We do recommend a small meal at first, but then you may proceed to your regular diet.  Drink plenty of fluids but you should avoid alcoholic beverages for 24 hours.  MEDICATIONS:  Continue present medications.  Please see handouts given to you by your recovery nurse.  Thank you for allowing us to  provide for your healthcare needs today.  ACTIVITY:  You should plan to take it easy for the rest of today and you should NOT DRIVE or use heavy machinery until tomorrow (because of the sedation medicines used during the test).    FOLLOW UP: Our staff will call the number listed on your records 48-72 hours following your procedure to check on you and address any questions or concerns that you may have regarding the information given to you following your procedure. If we do not reach you, we will leave a message.  We will attempt to reach you two times.  During this call, we will ask if you have developed any symptoms of COVID 19. If you develop any symptoms (ie: fever, flu-like symptoms, shortness of breath, cough etc.) before then, please call [phone].  If you test positive for Covid 19 in the 2 weeks post procedure, please call and report this information to us.    If any biopsies were taken you will be contacted by phone or by letter within the next 1-3 weeks.  Please call us at [phone] if you have not heard about the biopsies in 3 weeks.    SIGNATURES/CONFIDENTIALITY: You and/or your care partner have signed paperwork which will be entered into your electronic medical record.  These signatures attest to the fact that that the information above on your After Visit Summary has been reviewed and is understood.  Full responsibility of the   confidentiality of this discharge information lies with you and/or your care-partner.  

## 2021-04-21 NOTE — Progress Notes (Signed)
Called to room to assist during endoscopic procedure.  Patient ID and intended procedure confirmed with present staff. Received instructions for my participation in the procedure from the performing physician.  

## 2021-04-21 NOTE — Progress Notes (Signed)
GASTROENTEROLOGY PROCEDURE H&P NOTE   Primary Care Physician: Aretta Nip, MD    Reason for Procedure:  Colonoscopy  Plan:    Surveillance colonoscopy in the [facility]  Patient is appropriate for endoscopic procedure(s) in the ambulatory ([facility]) setting.  The nature of the procedure, as well as the risks, benefits, and alternatives were carefully and thoroughly reviewed with the patient. Ample time for discussion and questions allowed. The patient understood, was satisfied, and agreed to proceed.     HPI: Wanda Barber is a 65 y.o. female who presents for history of nonadvanced adenoma of the colon, diverticulosis with last colonoscopy March 2017.  She presents today for surveillance exam.  Medical history as below.  No complaints today.  Past Medical History:  Diagnosis Date   Allergy    SEASONAL   Anxiety    Hiatal hernia    HTN (hypertension), benign 11/12/2019   Hyperlipidemia    Schatzki's ring    Thyroid nodule     Past Surgical History:  Procedure Laterality Date   COLONOSCOPY  11/19/2015   DILATION AND CURETTAGE OF UTERUS  09/11/1989   due to miscariage    Prior to Admission medications   Medication Sig Start Date End Date Taking? Authorizing Provider  atorvastatin (LIPITOR) 20 MG tablet Take 20 mg by mouth daily.   Yes [provider]  cholecalciferol (VITAMIN D) 1000 UNITS tablet Take 1,000 Units by mouth daily.   Yes [provider]  fish oil-omega-3 fatty acids 1000 MG capsule Take 1 g by mouth daily.   Yes [provider]  levothyroxine (SYNTHROID) 25 MCG tablet Take 25 mcg by mouth daily. 08/10/20  Yes [provider]  losartan (COZAAR) 50 MG tablet TAKE 1 TABLET BY MOUTH EVERY DAY 10/08/20  Yes Jerline Pain, MD  Multiple Vitamins-Minerals (MULTIVITAMIN WITH MINERALS) tablet Take 1 tablet by mouth daily.   Yes [provider]  pantoprazole (PROTONIX) 40 MG tablet Take 40 mg by mouth as needed (for  heartburn).   Yes [provider]  LORazepam (ATIVAN) 0.5 MG tablet Take 0.5 mg by mouth daily as needed. Patient not taking: Reported on 04/21/2021 01/28/20   [provider]    Current Outpatient Medications  Medication Sig Dispense Refill   atorvastatin (LIPITOR) 20 MG tablet Take 20 mg by mouth daily.     cholecalciferol (VITAMIN D) 1000 UNITS tablet Take 1,000 Units by mouth daily.     fish oil-omega-3 fatty acids 1000 MG capsule Take 1 g by mouth daily.     levothyroxine (SYNTHROID) 25 MCG tablet Take 25 mcg by mouth daily.     losartan (COZAAR) 50 MG tablet TAKE 1 TABLET BY MOUTH EVERY DAY 90 tablet 3   Multiple Vitamins-Minerals (MULTIVITAMIN WITH MINERALS) tablet Take 1 tablet by mouth daily.     pantoprazole (PROTONIX) 40 MG tablet Take 40 mg by mouth as needed (for heartburn).     LORazepam (ATIVAN) 0.5 MG tablet Take 0.5 mg by mouth daily as needed. (Patient not taking: Reported on 04/21/2021)     Current Facility-Administered Medications  Medication Dose Route Frequency Provider Last Rate Last Admin   0.9 %  sodium chloride infusion  500 mL Intravenous Once Kenadi Miltner, Lajuan Lines, MD        Allergies as of 04/21/2021 - Review Complete 04/21/2021  Allergen Reaction Noted   Paxil [paroxetine hcl] Other (See Comments) 03/16/2015    Family History  Problem Relation Age of Onset   Heart  failure Mother    Rheum arthritis Mother    Asthma Mother    Colon polyps Father    Hypertension Father        PTCAx2, carotid endartectomy   Pulmonary fibrosis Father        colectomy   Heart disease Father        PTCAx2, carotid endartectomy   Fibromyalgia Sister    Heart failure Maternal Grandmother    Colon cancer Neg Hx    Esophageal cancer Neg Hx    Rectal cancer Neg Hx    Stomach cancer Neg Hx     Social History   Socioeconomic History   Marital status: Married    Spouse name: Not on file   Number of children: 2   Years of education: Not on file   Highest  education level: Not on file  Occupational History   Occupation: Sales  Tobacco Use   Smoking status: Never   Smokeless tobacco: Never   Tobacco comments:    Occassionally  Vaping Use   Vaping Use: Never used  Substance and Sexual Activity   Alcohol use: Yes    Alcohol/week: 7.0 standard drinks    Types: 7 Glasses of wine per week    Comment: glass of wine with dinner   Drug use: No   Sexual activity: Not on file  Other Topics Concern   Not on file  Social History Narrative   Caffeine: yes 2+ servings daily, tea   Exercise: yes, walks 45 minutes per day   Social Determinants of Health   Financial Resource Strain: Not on file  Food Insecurity: Not on file  Transportation Needs: Not on file  Physical Activity: Not on file  Stress: Not on file  Social Connections: Not on file  Intimate Partner Violence: Not on file    Physical Exam: Vital signs in last 24 hours: '@BP'$  (!) 152/96   Pulse 82   Temp 98.3 F (36.8 C) (Skin)   Ht '5\' 4"'$  (1.626 m)   Wt 129 lb (58.5 kg)   SpO2 97%   BMI 22.14 kg/m  GEN: NAD EYE: Sclerae anicteric ENT: MMM CV: Non-tachycardic Pulm: CTA b/l GI: Soft, NT/ND NEURO:  Alert & Oriented x 3   Zenovia Jarred, MD [facility]  04/21/2021 9:29 AM

## 2021-04-25 ENCOUNTER — Telehealth: Payer: Self-pay

## 2021-04-25 NOTE — Telephone Encounter (Signed)
  Follow up Call-  Call back number 04/21/2021  Post procedure Call Back phone  # [phone]  Permission to leave phone message Yes  Some recent data might be hidden     Patient questions:  Do you have a fever, pain , or abdominal swelling? No. Pain Score  0 *  Have you tolerated food without any problems? Yes.    Have you been able to return to your normal activities? Yes.    Do you have any questions about your discharge instructions: Diet   No. Medications  No. Follow up visit  No.  Do you have questions or concerns about your Care? No.  Actions: * If pain score is 4 or above: No action needed, pain <4.

## 2021-04-26 ENCOUNTER — Encounter: Payer: Self-pay | Admitting: Internal Medicine

## 2021-04-26 DIAGNOSIS — H43813 Vitreous degeneration, bilateral: Secondary | ICD-10-CM | POA: Diagnosis not present

## 2021-04-26 DIAGNOSIS — H2513 Age-related nuclear cataract, bilateral: Secondary | ICD-10-CM | POA: Diagnosis not present

## 2021-04-26 DIAGNOSIS — H35373 Puckering of macula, bilateral: Secondary | ICD-10-CM | POA: Diagnosis not present

## 2021-04-26 DIAGNOSIS — H3581 Retinal edema: Secondary | ICD-10-CM | POA: Diagnosis not present

## 2021-07-04 DIAGNOSIS — H3581 Retinal edema: Secondary | ICD-10-CM | POA: Diagnosis not present

## 2021-07-04 DIAGNOSIS — H35372 Puckering of macula, left eye: Secondary | ICD-10-CM | POA: Diagnosis not present

## 2021-07-04 DIAGNOSIS — H2512 Age-related nuclear cataract, left eye: Secondary | ICD-10-CM | POA: Diagnosis not present

## 2021-07-05 DIAGNOSIS — H35372 Puckering of macula, left eye: Secondary | ICD-10-CM | POA: Diagnosis not present

## 2021-07-12 DIAGNOSIS — H35372 Puckering of macula, left eye: Secondary | ICD-10-CM | POA: Diagnosis not present

## 2021-07-12 DIAGNOSIS — H3581 Retinal edema: Secondary | ICD-10-CM | POA: Diagnosis not present

## 2021-08-09 DIAGNOSIS — H3581 Retinal edema: Secondary | ICD-10-CM | POA: Diagnosis not present

## 2021-08-09 DIAGNOSIS — H35371 Puckering of macula, right eye: Secondary | ICD-10-CM | POA: Diagnosis not present

## 2021-10-05 DIAGNOSIS — M21612 Bunion of left foot: Secondary | ICD-10-CM | POA: Diagnosis not present

## 2021-10-05 DIAGNOSIS — M21611 Bunion of right foot: Secondary | ICD-10-CM | POA: Diagnosis not present

## 2021-10-16 ENCOUNTER — Other Ambulatory Visit: Payer: Self-pay | Admitting: Cardiology

## 2021-11-09 ENCOUNTER — Other Ambulatory Visit: Payer: Self-pay | Admitting: Cardiology

## 2021-11-19 ENCOUNTER — Other Ambulatory Visit: Payer: Self-pay | Admitting: Cardiology

## 2021-11-21 ENCOUNTER — Other Ambulatory Visit: Payer: Self-pay | Admitting: Cardiology

## 2021-11-30 DIAGNOSIS — Z0000 Encounter for general adult medical examination without abnormal findings: Secondary | ICD-10-CM | POA: Diagnosis not present

## 2021-11-30 DIAGNOSIS — Z23 Encounter for immunization: Secondary | ICD-10-CM | POA: Diagnosis not present

## 2021-11-30 DIAGNOSIS — E7800 Pure hypercholesterolemia, unspecified: Secondary | ICD-10-CM | POA: Diagnosis not present

## 2021-11-30 DIAGNOSIS — F419 Anxiety disorder, unspecified: Secondary | ICD-10-CM | POA: Diagnosis not present

## 2021-11-30 DIAGNOSIS — E039 Hypothyroidism, unspecified: Secondary | ICD-10-CM | POA: Diagnosis not present

## 2021-11-30 DIAGNOSIS — I10 Essential (primary) hypertension: Secondary | ICD-10-CM | POA: Diagnosis not present

## 2021-12-09 DIAGNOSIS — E039 Hypothyroidism, unspecified: Secondary | ICD-10-CM | POA: Diagnosis not present

## 2021-12-09 DIAGNOSIS — E7800 Pure hypercholesterolemia, unspecified: Secondary | ICD-10-CM | POA: Diagnosis not present

## 2021-12-09 DIAGNOSIS — I10 Essential (primary) hypertension: Secondary | ICD-10-CM | POA: Diagnosis not present

## 2021-12-23 DIAGNOSIS — Z6821 Body mass index (BMI) 21.0-21.9, adult: Secondary | ICD-10-CM | POA: Diagnosis not present

## 2021-12-23 DIAGNOSIS — Z1231 Encounter for screening mammogram for malignant neoplasm of breast: Secondary | ICD-10-CM | POA: Diagnosis not present

## 2021-12-23 DIAGNOSIS — Z01419 Encounter for gynecological examination (general) (routine) without abnormal findings: Secondary | ICD-10-CM | POA: Diagnosis not present

## 2021-12-29 ENCOUNTER — Other Ambulatory Visit: Payer: Self-pay | Admitting: Obstetrics and Gynecology

## 2021-12-29 DIAGNOSIS — E2839 Other primary ovarian failure: Secondary | ICD-10-CM

## 2022-02-25 ENCOUNTER — Other Ambulatory Visit: Payer: Self-pay | Admitting: Cardiology

## 2022-02-28 ENCOUNTER — Other Ambulatory Visit: Payer: Self-pay | Admitting: Cardiology

## 2022-03-20 ENCOUNTER — Ambulatory Visit: Payer: BC Managed Care – PPO | Admitting: Cardiology

## 2022-03-20 ENCOUNTER — Encounter: Payer: Self-pay | Admitting: Cardiology

## 2022-03-20 DIAGNOSIS — E7800 Pure hypercholesterolemia, unspecified: Secondary | ICD-10-CM

## 2022-03-20 DIAGNOSIS — I10 Essential (primary) hypertension: Secondary | ICD-10-CM

## 2022-03-20 NOTE — Assessment & Plan Note (Signed)
Blood pressure slightly elevated today in the 044 systolic range.  She will check again at home.  She will message me with results.  Continue for now with losartan 50 mg.

## 2022-03-20 NOTE — Progress Notes (Signed)
Cardiology Office Note:    Date:  03/20/2022   ID:  Wanda Barber, DOB Jan 30, 1956, MRN [id number]  PCP:  Wanda Nip, MD   [facility] Providers Cardiologist:  Candee Furbish, MD     Referring MD: Wanda Nip, MD    History of Present Illness:    Wanda Barber is a 65 y.o. female here for the follow-up of palpitations and hypertension.  Noted palpitations occasionally when laying down in bed.  Usually does not notice it when on the couch.  Symptoms are suggestive of PVCs or PACs.  Her EKG remains reassuring.  No high risk symptoms such as syncope.  Family history of CAD. Mother 83 myocardial infarction  Tolerating her medications well.  She is going to be retiring soon.  Excellent.  Her son is getting married.  No fevers chills nausea vomiting syncope bleeding  Past Medical History:  Diagnosis Date   Allergy    SEASONAL   Anxiety    Hiatal hernia    HTN (hypertension), benign 11/12/2019   Hyperlipidemia    Schatzki's ring    Thyroid nodule     Past Surgical History:  Procedure Laterality Date   COLONOSCOPY  11/19/2015   DILATION AND CURETTAGE OF UTERUS  09/11/1989   due to miscariage    Current Medications: Current Meds  Medication Sig   atorvastatin (LIPITOR) 20 MG tablet Take 20 mg by mouth daily.   cholecalciferol (VITAMIN D) 1000 UNITS tablet Take 1,000 Units by mouth daily.   fish oil-omega-3 fatty acids 1000 MG capsule Take 1 g by mouth daily.   levothyroxine (SYNTHROID) 25 MCG tablet Take 25 mcg by mouth daily.   LORazepam (ATIVAN) 0.5 MG tablet Take 0.5 mg by mouth daily as needed.   losartan (COZAAR) 50 MG tablet TAKE 1 TABLET DAILY   Multiple Vitamins-Minerals (MULTIVITAMIN WITH MINERALS) tablet Take 1 tablet by mouth daily.   pantoprazole (PROTONIX) 40 MG tablet Take 40 mg by mouth as needed (for heartburn).   Current Facility-Administered Medications for the 03/20/22 encounter (Office Visit) with Jerline Pain, MD  Medication    0.9 %  sodium chloride infusion     Allergies:   Paxil [paroxetine hcl]   Social History   Socioeconomic History   Marital status: Married    Spouse name: Not on file   Number of children: 2   Years of education: Not on file   Highest education level: Not on file  Occupational History   Occupation: Sales  Tobacco Use   Smoking status: Never   Smokeless tobacco: Never   Tobacco comments:    Occassionally  Vaping Use   Vaping Use: Never used  Substance and Sexual Activity   Alcohol use: Yes    Alcohol/week: 7.0 standard drinks of alcohol    Types: 7 Glasses of wine per week    Comment: glass of wine with dinner   Drug use: No   Sexual activity: Not on file  Other Topics Concern   Not on file  Social History Narrative   Caffeine: yes 2+ servings daily, tea   Exercise: yes, walks 45 minutes per day   Social Determinants of Health   Financial Resource Strain: Not on file  Food Insecurity: Not on file  Transportation Needs: Not on file  Physical Activity: Not on file  Stress: Not on file  Social Connections: Not on file     Family History: The patient's family history includes Asthma in her mother; Colon polyps in  her father; Fibromyalgia in her sister; Heart disease in her father; Heart failure in her maternal grandmother and mother; Hypertension in her father; Pulmonary fibrosis in her father; Rheum arthritis in her mother. There is no history of Colon cancer, Esophageal cancer, Rectal cancer, or Stomach cancer.  ROS:   Please see the history of present illness.     All other systems reviewed and are negative.  EKGs/Labs/Other Studies Reviewed:    The following studies were reviewed today: Echocardiogram 2017:  - Left ventricle: The cavity size was normal. Wall thickness was    normal. Systolic function was normal. The estimated ejection    fraction was in the range of 60% to 65%. Wall motion was normal;    there were no regional wall motion abnormalities.  -  Mitral valve: There was mild regurgitation.  - Tricuspid valve: There was trivial regurgitation.  - Pulmonary arteries: Systolic pressure was mildly increased. PA    peak pressure: 37 mm Hg (S).   EKG:  EKG is  ordered today.  The ekg ordered today demonstrates sinus rhythm 74 no other changes  Recent Labs: No results found for requested labs within last 365 days.  Recent Lipid Panel No results found for: "CHOL", "TRIG", "HDL", "CHOLHDL", "VLDL", "LDLCALC", "LDLDIRECT"   Risk Assessment/Calculations:              Physical Exam:    VS:  BP 140/80 (BP Location: Left Arm, Patient Position: Sitting, Cuff Size: Normal)   Pulse 74   Ht '5\' 4"'$  (1.626 m)   Wt 129 lb (58.5 kg)   BMI 22.14 kg/m     Wt Readings from Last 3 Encounters:  03/20/22 129 lb (58.5 kg)  04/21/21 129 lb (58.5 kg)  04/07/21 129 lb (58.5 kg)     GEN:  Well nourished, well developed in no acute distress HEENT: Normal NECK: No JVD; No carotid bruits LYMPHATICS: No lymphadenopathy CARDIAC: RRR, no murmurs, no rubs, gallops RESPIRATORY:  Clear to auscultation without rales, wheezing or rhonchi  ABDOMEN: Soft, non-tender, non-distended MUSCULOSKELETAL:  No edema; No deformity  SKIN: Warm and dry NEUROLOGIC:  Alert and oriented x 3 PSYCHIATRIC:  Normal affect   ASSESSMENT:    1. Pure hypercholesterolemia   2. HTN (hypertension), benign    PLAN:    In order of problems listed above:  Hyperlipidemia Atorvastatin 20 mg once a day no myalgias continue with current prescription drug management.  Last LDL 68, ALT 15.  Excellent.  HTN (hypertension), benign Blood pressure slightly elevated today in the 244 systolic range.  She will check again at home.  She will message me with results.  Continue for now with losartan 50 mg.  Palpitations Rare skips.  No high risk symptoms.  See above         Medication Adjustments/Labs and Tests Ordered: Current medicines are reviewed at length with the patient  today.  Concerns regarding medicines are outlined above.  Orders Placed This Encounter  Procedures   EKG 12-Lead   No orders of the defined types were placed in this encounter.   Patient Instructions  Medication Instructions:  Your physician recommends that you continue on your current medications as directed. Please refer to the Current Medication list given to you today.  *If you need a refill on your cardiac medications before your next appointment, please call your pharmacy*  Follow-Up: At [facility], you and your health needs are our priority.  As part of our continuing mission to provide  you with exceptional heart care, we have created designated Provider Care Teams.  These Care Teams include your primary Cardiologist (physician) and Advanced Practice Providers (APPs -  Physician Assistants and Nurse Practitioners) who all work together to provide you with the care you need, when you need it.   Your next appointment:   12 month(s)  The format for your next appointment:   In Person  Provider:   Candee Furbish, MD     Other Instructions Your physician has requested that you regularly monitor and record your blood pressure readings at home. Please use the same machine at the same time of day to check your readings and record them to bring to your follow-up visit.            Signed, Candee Furbish, MD  03/20/2022 4:27 PM    [place]

## 2022-03-20 NOTE — Patient Instructions (Signed)
Medication Instructions:  Your physician recommends that you continue on your current medications as directed. Please refer to the Current Medication list given to you today.  *If you need a refill on your cardiac medications before your next appointment, please call your pharmacy*  Follow-Up: At [facility], you and your health needs are our priority.  As part of our continuing mission to provide you with exceptional heart care, we have created designated Provider Care Teams.  These Care Teams include your primary Cardiologist (physician) and Advanced Practice Providers (APPs -  Physician Assistants and Nurse Practitioners) who all work together to provide you with the care you need, when you need it.   Your next appointment:   12 month(s)  The format for your next appointment:   In Person  Provider:   Candee Furbish, MD     Other Instructions Your physician has requested that you regularly monitor and record your blood pressure readings at home. Please use the same machine at the same time of day to check your readings and record them to bring to your follow-up visit.

## 2022-03-20 NOTE — Assessment & Plan Note (Signed)
Rare skips.  No high risk symptoms.  See above

## 2022-03-20 NOTE — Assessment & Plan Note (Signed)
Atorvastatin 20 mg once a day no myalgias continue with current prescription drug management.  Last LDL 68, ALT 15.  Excellent.

## 2022-06-05 ENCOUNTER — Other Ambulatory Visit: Payer: Self-pay | Admitting: Cardiology

## 2022-07-31 ENCOUNTER — Telehealth: Payer: Self-pay

## 2022-07-31 NOTE — Patient Instructions (Signed)
Visit Information  Thank you for taking time to visit with me today. Please don't hesitate to contact me if I can be of assistance to you.   Following are the goals we discussed today:   Goals Addressed             This Visit's Progress    COMPLETED: Care Coordination Activities - no follow up required       Care Coordination Interventions: Provided education to patient re: care coordination services,  Assessed social determinant of health barriers           If you are experiencing a Mental Health or [place] or need someone to talk to, please call the Suicide and Crisis Lifeline: 988 call the [place] National Suicide Prevention Lifeline: 219-052-7691 or TTY: 2407800807 TTY 6815110457) to talk to a trained counselor call 1-800-273-TALK (toll free, 24 hour hotline) go to [facility] [place] [phone]) call 911   Patient verbalizes understanding of instructions and care plan provided today and agrees to view in [place]. Active MyChart status and patient understanding of how to access instructions and care plan via MyChart confirmed with patient.     No further follow up required:    Peter Garter RN, Jackquline Denmark, [place] [facility] [phone]

## 2022-07-31 NOTE — Patient Outreach (Signed)
  Care Coordination   07/31/2022 Name: Wanda Barber MRN: [id number] DOB: March 03, 1956   Care Coordination Outreach Attempts:  An unsuccessful telephone outreach was attempted today to offer the patient information about available care coordination services as a benefit of their health plan.   Follow Up Plan:  Additional outreach attempts will be made to offer the patient care coordination information and services.   Encounter Outcome:  No Answer  Care Coordination Interventions Activated:  No   Care Coordination Interventions:  No, not indicated    Peter Garter RN, BSN,CCM, [place] [facility] [phone]

## 2022-07-31 NOTE — Patient Outreach (Signed)
  Care Coordination   Initial Visit Note   07/31/2022 Name: Wanda Barber MRN: [id number] DOB: 14-Sep-1955  Wanda Barber is a 66 y.o. year old female who sees Rankins, Bill Salinas, MD for primary care. I spoke with  Wanda Barber by phone today.  What matters to the patients health and wellness today?  No concerns today.  My B/P is doing good    Goals Addressed             This Visit's Progress    COMPLETED: Care Coordination Activities - no follow up required       Care Coordination Interventions: Provided education to patient re: care coordination services,  Assessed social determinant of health barriers          SDOH assessments and interventions completed:  Yes  SDOH Interventions Today    Flowsheet Row Most Recent Value  SDOH Interventions   Food Insecurity Interventions Intervention Not Indicated  Housing Interventions Intervention Not Indicated  Transportation Interventions Intervention Not Indicated  Utilities Interventions Intervention Not Indicated  Financial Strain Interventions Intervention Not Indicated        Care Coordination Interventions Activated:  Yes  Care Coordination Interventions:  Yes, provided   Follow up plan: No further intervention required.   Encounter Outcome:  Pt. Visit Completed  Peter Garter RN, BSN,CCM, CDE Care Management Coordinator [place] [facility] [phone]

## 2023-01-04 ENCOUNTER — Other Ambulatory Visit: Payer: Self-pay | Admitting: Family Medicine

## 2023-01-04 DIAGNOSIS — Z780 Asymptomatic menopausal state: Secondary | ICD-10-CM

## 2023-01-04 DIAGNOSIS — Z13820 Encounter for screening for osteoporosis: Secondary | ICD-10-CM

## 2023-03-03 ENCOUNTER — Other Ambulatory Visit: Payer: Self-pay | Admitting: Cardiology

## 2023-03-05 ENCOUNTER — Other Ambulatory Visit: Payer: Self-pay

## 2023-03-05 MED ORDER — LOSARTAN POTASSIUM 50 MG PO TABS: 50.0000 mg | ORAL_TABLET | Freq: Every day | ORAL | 0 refills | Status: DC

## 2023-03-06 ENCOUNTER — Other Ambulatory Visit: Payer: Self-pay | Admitting: Cardiology

## 2023-07-05 ENCOUNTER — Ambulatory Visit: Payer: Medicare Other | Attending: Cardiology | Admitting: Cardiology

## 2023-07-05 ENCOUNTER — Encounter: Payer: Self-pay | Admitting: Cardiology

## 2023-07-05 VITALS — BP 116/78 | HR 73 | Ht 64.0 in | Wt 132.6 lb

## 2023-07-05 DIAGNOSIS — E7800 Pure hypercholesterolemia, unspecified: Secondary | ICD-10-CM | POA: Diagnosis present

## 2023-07-05 DIAGNOSIS — R002 Palpitations: Secondary | ICD-10-CM | POA: Insufficient documentation

## 2023-07-05 DIAGNOSIS — I10 Essential (primary) hypertension: Secondary | ICD-10-CM | POA: Diagnosis present

## 2023-07-05 NOTE — Progress Notes (Signed)
Cardiology Office Note:  .   Date:  07/05/2023  ID:  Ignacia Bayley, DOB 08-05-1956, MRN [id number] PCP: Clayborn Heron, MD  [facility] Providers Cardiologist:  Donato Schultz, MD     History of Present Illness: .   Wanda Barber is a 67 y.o. female Discussed with the use of AI scribe   History of Present Illness   The patient, a 67 year old female with a family history of coronary disease, presents for a follow-up visit. The patient's mother had a myocardial infarction at the age of 93. The patient has been experiencing palpitations suggestive of PVCs and PACs, along with hypertension. The EKG results have been reassuring. The patient is currently on Atorvastatin 20mg  for hyperlipidemia and Losartan 50mg  daily for hypertension. The patient reports occasional alcohol consumption. An echocardiogram conducted in 2017 showed normal pump function.  The patient reports that the palpitations typically occur at night when lying down to sleep. Despite the palpitations, the patient has been feeling well overall and has been maintaining an active lifestyle, including walking and core exercises at home. The patient has recently retired and has been enjoying the freedom to travel. Going on [employer].   The patient's recent lab results show a creatinine level of 0.9, hemoglobin of 13.7, and TSH of 2, all within normal ranges. The patient's LDL was 84 in March of 2024, indicating good control of hyperlipidemia. The patient's blood pressure has been well-controlled on the current regimen of Losartan.           Studies Reviewed: Marland Kitchen   EKG Interpretation Date/Time:  Thursday July 05 2023 14:57:19 EDT Ventricular Rate:  73 PR Interval:  150 QRS Duration:  68 QT Interval:  384 QTC Calculation: 423 R Axis:   -2  Text Interpretation: Normal sinus rhythm Minimal voltage criteria for LVH, may be normal variant ( R in aVL ) Nonspecific T wave abnormality When compared with ECG of  29-May-1999 09:25, No significant change since last tracing Confirmed by Donato Schultz ([id number]) on 07/05/2023 3:00:15 PM    Results LABS LDL: 84 (11/2022) Creatinine: 0.9 Hemoglobin: 13.7 TSH: 2.0  DIAGNOSTIC Echocardiogram: Normal ejection fraction (2017) EKG: Normal  Risk Assessment/Calculations:            Physical Exam:   VS:  BP 116/78   Pulse 73   Ht 5\' 4"  (1.626 m)   Wt 132 lb 9.6 oz (60.1 kg)   SpO2 98%   BMI 22.76 kg/m    Wt Readings from Last 3 Encounters:  07/05/23 132 lb 9.6 oz (60.1 kg)  03/20/22 129 lb (58.5 kg)  04/21/21 129 lb (58.5 kg)    GEN: Well nourished, well developed in no acute distress NECK: No JVD; No carotid bruits CARDIAC: RRR, no murmurs, no rubs, no gallops RESPIRATORY:  Clear to auscultation without rales, wheezing or rhonchi  ABDOMEN: Soft, non-tender, non-distended EXTREMITIES:  No edema; No deformity   ASSESSMENT AND PLAN: .       Premature Ventricular Contractions (PVCs) and Premature Atrial Contractions (PACs) Occasional symptoms, primarily at night. EKG has been reassuring. -Continue current management.  Hyperlipidemia Well controlled on Atorvastatin 20mg  daily. LDL was 84 in March 2024. -Continue Atorvastatin 20mg  daily.  Hypertension Well controlled on Losartan 50mg  daily. Blood pressure was within normal range during the visit. -Continue Losartan 50mg  daily.  General Health Maintenance / Followup Plans -Return for follow-up in 2 years unless there are changes in symptoms or new concerns arise.  Signed, Donato Schultz, MD

## 2023-07-05 NOTE — Patient Instructions (Signed)
Medication Instructions:  Your physician recommends that you continue on your current medications as directed. Please refer to the Current Medication list given to you today.  *If you need a refill on your cardiac medications before your next appointment, please call your pharmacy*  Follow-Up: At [facility], you and your health needs are our priority.  As part of our continuing mission to provide you with exceptional heart care, we have created designated Provider Care Teams.  These Care Teams include your primary Cardiologist (physician) and Advanced Practice Providers (APPs -  Physician Assistants and Nurse Practitioners) who all work together to provide you with the care you need, when you need it.  Your next appointment:   2 year(s)  The format for your next appointment:   In Person  Provider:   Donato Schultz, MD

## 2023-07-23 ENCOUNTER — Ambulatory Visit
Admission: RE | Admit: 2023-07-23 | Discharge: 2023-07-23 | Disposition: A | Discharge status: Home / Self Care | Payer: Medicare Other | Source: Ambulatory Visit | Attending: Family Medicine | Admitting: Family Medicine

## 2023-07-23 DIAGNOSIS — Z13820 Encounter for screening for osteoporosis: Secondary | ICD-10-CM

## 2023-07-23 DIAGNOSIS — Z780 Asymptomatic menopausal state: Secondary | ICD-10-CM

## 2023-08-30 ENCOUNTER — Other Ambulatory Visit: Payer: Self-pay | Admitting: Cardiology
